# Patient Record
Sex: Female | Born: 1975 | Race: Black or African American | Hispanic: No | Marital: Single | Smoking: Current some day smoker
Health system: Southern US, Community
[De-identification: ages and names within clinical notes are randomized; demographics above are authoritative.]

## PROBLEM LIST (undated history)

## (undated) DIAGNOSIS — F209 Schizophrenia, unspecified: Secondary | ICD-10-CM

## (undated) HISTORY — PX: OTHER SURGICAL HISTORY: SHX169

---

## 2013-02-20 ENCOUNTER — Emergency Department (HOSPITAL_BASED_OUTPATIENT_CLINIC_OR_DEPARTMENT_OTHER)
Admission: EM | Admit: 2013-02-20 | Discharge: 2013-02-20 | Disposition: A | Discharge status: Home / Self Care | Payer: Self-pay | Attending: Emergency Medicine | Admitting: Emergency Medicine

## 2013-02-20 ENCOUNTER — Encounter (HOSPITAL_BASED_OUTPATIENT_CLINIC_OR_DEPARTMENT_OTHER): Payer: Self-pay | Admitting: Emergency Medicine

## 2013-02-20 DIAGNOSIS — Z8659 Personal history of other mental and behavioral disorders: Secondary | ICD-10-CM | POA: Insufficient documentation

## 2013-02-20 DIAGNOSIS — G2589 Other specified extrapyramidal and movement disorders: Secondary | ICD-10-CM | POA: Insufficient documentation

## 2013-02-20 DIAGNOSIS — T43505A Adverse effect of unspecified antipsychotics and neuroleptics, initial encounter: Secondary | ICD-10-CM | POA: Insufficient documentation

## 2013-02-20 DIAGNOSIS — Z79899 Other long term (current) drug therapy: Secondary | ICD-10-CM | POA: Insufficient documentation

## 2013-02-20 DIAGNOSIS — F17200 Nicotine dependence, unspecified, uncomplicated: Secondary | ICD-10-CM | POA: Insufficient documentation

## 2013-02-20 HISTORY — DX: Schizophrenia, unspecified: F20.9

## 2013-02-20 MED ORDER — LORAZEPAM 1 MG PO TABS: 1.0000 mg | ORAL_TABLET | Freq: Four times a day (QID) | ORAL | Status: DC | PRN

## 2013-02-20 MED ORDER — LORAZEPAM 1 MG PO TABS
1.0000 mg | ORAL_TABLET | Freq: Once | ORAL | Status: AC
  Administered 2013-02-20: 1 mg via ORAL
  Filled 2013-02-20: qty 1

## 2013-02-20 NOTE — ED Provider Notes (Signed)
CSN: [id number]     Arrival date & time 02/20/13  1529 History  This chart was scribed for Neta Ehlers, MD by Elby Beck, ED Scribe. This patient was seen in room MH09/MH09 and the patient's care was started at 3:52 PM.   Chief Complaint  Patient presents with  . possible reaction to haldol     Patient is a 38 y.o. female presenting with allergic reaction. The history is provided by the patient and a parent. No language interpreter was used.  Allergic Reaction Presenting symptoms: no difficulty breathing and no difficulty swallowing   Presenting symptoms comment:  Inability to keep tongue in mouth, bilateral arm twitching Severity:  Moderate Prior episodes: history of similar reactions to Haldol. Context: medications (recently started back on Haldol)   Relieved by:  None tried Worsened by:  Nothing tried Ineffective treatments:  None tried   HPI Comments: Elizabeth Adkins is a 38 y.o. Female with a history of schizophrenia brought by EMS and accompanied by mother to the Emergency Department complaining of a suspected medication reaction that began 4 hours ago. She states that she recently began taking Haldol again, which she had taken in the past, but had been off of for 10 months. She states that she took 2 Haldol tablets Saturday, but that she has not had any yesterday or today. She states that about 4 hours ago, she had an onset of being unable to keep her tongue in her mouth along with bilateral arm twitching. She also reports feeling jittery. She states that her tongue feels swollen, but that she is still able to swallow normally. She states that she has a history of similar reactions with Haldol in the past- about a year ago. She states that the only other medication she has recently taken is Tylenol PM. She states that she is doing well with her history of schizophrenia, and she denies SI or HI. She denies nausea, emesis, diarrhea, fever, diaphoresis, heart racing, swelling anywhere  in her body or any other symptoms.   Past Medical History  Diagnosis Date  . Schizophrenia    History reviewed. No pertinent past surgical history. History reviewed. No pertinent family history. History  Substance Use Topics  . Smoking status: Current Some Day Smoker  . Smokeless tobacco: Not on file  . Alcohol Use: No   OB History   Grav Para Term Preterm Abortions TAB SAB Ect Mult Living                 Review of Systems  Constitutional: Negative for fever, chills, diaphoresis, activity change, appetite change and fatigue.  HENT: Negative for congestion, facial swelling, rhinorrhea, sore throat and trouble swallowing.        Inability to keep tongue in mouth  Eyes: Negative for photophobia and discharge.  Respiratory: Negative for cough, chest tightness and shortness of breath.   Cardiovascular: Negative for chest pain, palpitations and leg swelling.  Gastrointestinal: Negative for nausea, vomiting, abdominal pain and diarrhea.  Endocrine: Negative for polydipsia and polyuria.  Genitourinary: Negative for dysuria, frequency, difficulty urinating and pelvic pain.  Musculoskeletal: Negative for arthralgias, back pain, neck pain and neck stiffness.  Skin: Negative for color change and wound.  Allergic/Immunologic: Negative for immunocompromised state.  Neurological: Negative for facial asymmetry, weakness, numbness and headaches.       Bilateral arm twitching  Hematological: Does not bruise/bleed easily.  Psychiatric/Behavioral: Negative for suicidal ideas, confusion and agitation.       Feels "jittery". Denies  HI.    Allergies  Septra  Home Medications   Current Outpatient Rx  Name  Route  Sig  Dispense  Refill  . haloperidol (HALDOL) 10 MG tablet   Oral   Take 10 mg by mouth 4 (four) times daily.          Triage Vitals: BP 131/79  Pulse 114  Temp(Src) 98.1 F (36.7 C) (Oral)  Resp 16  SpO2 99%  Physical Exam  Nursing note and vitals  reviewed. Constitutional: She is oriented to person, place, and time. She appears well-developed and well-nourished. No distress.  HENT:  Head: Normocephalic.  Mouth/Throat: Oropharynx is clear and moist.  Repetitive tongue protruding movements.  Eyes: Pupils are equal, round, and reactive to light.  Neck: Neck supple.  Cardiovascular: Normal rate, regular rhythm and normal heart sounds.   Pulmonary/Chest: Effort normal and breath sounds normal. No respiratory distress. She has no wheezes.  Abdominal: Soft. She exhibits no distension. There is no tenderness. There is no rebound and no guarding.  Musculoskeletal: She exhibits no edema and no tenderness.  Neurological: She is alert and oriented to person, place, and time. She has normal strength. She displays no tremor. No cranial nerve deficit or sensory deficit. She exhibits normal muscle tone. Coordination and gait normal. GCS eye subscore is 4. GCS verbal subscore is 5. GCS motor subscore is 6.  Abnormal protruding mvmts of tongue  Skin: Skin is warm and dry.  Psychiatric: She has a normal mood and affect. Her speech is normal and behavior is normal. Thought content normal. Her mood appears not anxious. Her affect is not blunt and not labile. She is not actively hallucinating. She does not exhibit a depressed mood. She expresses no suicidal plans and no homicidal plans.    ED Course  Procedures (including critical care time)  DIAGNOSTIC STUDIES: Oxygen Saturation is 99% on RA, normal by my interpretation.    COORDINATION OF CARE: 4:00 PM- Ordered Ativan. Pt advised of plan for treatment and pt agrees.  Medications  LORazepam (ATIVAN) tablet 1 mg (1 mg Oral Given 02/20/13 1614)   Labs Review Labs Reviewed - No data to display Imaging Review No results found.  EKG Interpretation   None       MDM   1. Antipsychotic-induced akathisia, initial encounter    Pt is a 38 y.o. female with Pmhx as above who presents with akathisias  of tongue after restarting haldol 2 days ago.  She had not taken it in about 10 months and reports similar symptoms in the past. She denies current SI/HI, but told nursing she had SI 2 days ago which was why she took the haldol. She states her recent schizophrenia symptoms have been minimal. She also reports abnml, uncontrolled mvmts of her L arm, though I have not seen this on PE.  Other than tongue mvmts, PE nml.  No oropharyngeal swelling/edema.  Neuro exam otherwise benign. She is mildly tachycardic, but BP nml, no diaphoresis, no n./v, d/a, CP, SOB.  Pt given 50mg  PO benadryl by EMS,  Have given 1mg  ativan PO in dept. I suspect Akathisia due to haldol use, doubt tardive dyskinesia given the timing of her drug use.    5:26 PM Symptoms resolved.  Pt again denying current SI/HI, states she will call 911 or come back to ED if she has returning thoughts of SI/HI.  She will call her mental health provider at [facility] for close follow up appointment. She does not want to talk  to psychiatry today and with good f/u plan, denial of SI/HI, I feel she is safe to do so.      I personally performed the services described in this documentation, which was scribed in my presence. The recorded information has been reviewed and is accurate.     Neta Ehlers, MD 02/20/13 [phone]

## 2013-02-20 NOTE — Discharge Instructions (Signed)
Dystonias  The dystonias are movement disorders in which sustained muscle contractions cause twisting and repetitive movements or abnormal postures. The movements, which are involuntary and sometimes painful, may affect a single muscle; a group of muscles such as those in the arms, legs, or neck; or the entire body. Early symptoms (problems) may include a deterioration in handwriting after writing several lines, foot cramps, and a tendency of one foot to pull up or drag after running or walking some distance. Other possible symptoms are tremor and voice or speech difficulties. Birth injury (particularly due to lack of oxygen), certain infections, reactions to certain drugs, heavy-metal or carbon monoxide poisoning, trauma (damage caused by an accident), or stroke can cause dystonic symptoms. About half the cases of dystonia have no connection to disease or injury and are called primary or idiopathic dystonia. Of the primary dystonias, many cases appear to be inherited in a dominant manner. Dystonias can also be symptoms of other diseases, some of which may be hereditary (passed down from parents). In some individuals, symptoms of a dystonia appear spontaneously in childhood between the ages of 5 and 16, usually in the foot or in the hand. For other individuals, the symptoms emerge in late adolescence or early adulthood.  TREATMENT   No one treatment has been found universally effective for dystonia. Instead, physicians use a variety of therapies (medications, surgery and other treatments such as physical therapy, splinting, stress management, and biofeedback), aimed at reducing or eliminating muscle spasms and pain. Since response to drugs varies among patients and even in the same person over time, the therapy must be individualized.  PROGNOSIS  The initial symptoms can be very mild and may be noticeable only after prolonged exertion, stress, or fatigue. Over a period of time, the symptoms may become more  noticeable and widespread and be unrelenting; sometimes, however, there is little or no progression.  RESEARCH BEING DONE  Investigators believe that the dystonias result from an abnormality in an area of the brain called the basal ganglia, where some of the messages that initiate muscle contractions are processed. Scientists suspect a defect in the body's ability to process a group of chemicals called neurotransmitters that help cells in the brain communicate with each other. Scientists at the [facility] laboratories have conducted detailed investigations of the pattern of muscle activity in persons with dystonias. Studies using EEG analysis and neuroimaging are probing brain activity. The search for the gene or genes responsible for some forms of dominantly inherited dystonias continues. In 1989, a team of researchers mapped a gene for early-onset torsion dystonia to chromosome 9; the gene was subsequently named DYT1. In 1997, the team sequenced the DYT1 gene and found that it codes for a previously unknown protein now called "torsin A."  Document Released: 01/23/2002 Document Revised: 04/27/2011 Document Reviewed: 02/02/2005  ExitCare® Patient Information ©2014 [employer].

## 2013-02-20 NOTE — ED Notes (Signed)
Pt called ems  For feeling like cant keep tongue in mouth. Pt can put tongue in mouth and swallow without any difficulty. While talking to this patient she begins to have some movements of hands and arms . Pt has recently started taking haldol again and took two on Saturday.

## 2013-07-19 ENCOUNTER — Encounter (HOSPITAL_BASED_OUTPATIENT_CLINIC_OR_DEPARTMENT_OTHER): Payer: Self-pay | Admitting: Emergency Medicine

## 2013-07-19 ENCOUNTER — Emergency Department (HOSPITAL_BASED_OUTPATIENT_CLINIC_OR_DEPARTMENT_OTHER): Payer: Self-pay

## 2013-07-19 ENCOUNTER — Emergency Department (HOSPITAL_BASED_OUTPATIENT_CLINIC_OR_DEPARTMENT_OTHER)
Admission: EM | Admit: 2013-07-19 | Discharge: 2013-07-19 | Disposition: A | Discharge status: Home / Self Care | Payer: Self-pay | Attending: Emergency Medicine | Admitting: Emergency Medicine

## 2013-07-19 DIAGNOSIS — F17200 Nicotine dependence, unspecified, uncomplicated: Secondary | ICD-10-CM | POA: Insufficient documentation

## 2013-07-19 DIAGNOSIS — Z8659 Personal history of other mental and behavioral disorders: Secondary | ICD-10-CM | POA: Insufficient documentation

## 2013-07-19 DIAGNOSIS — Z3202 Encounter for pregnancy test, result negative: Secondary | ICD-10-CM | POA: Insufficient documentation

## 2013-07-19 DIAGNOSIS — D259 Leiomyoma of uterus, unspecified: Secondary | ICD-10-CM | POA: Insufficient documentation

## 2013-07-19 DIAGNOSIS — D219 Benign neoplasm of connective and other soft tissue, unspecified: Secondary | ICD-10-CM

## 2013-07-19 LAB — CBC WITH DIFFERENTIAL/PLATELET
BASOS ABS: 0 10*3/uL (ref 0.0–0.1)
BASOS PCT: 0 % (ref 0–1)
EOS ABS: 0.4 10*3/uL (ref 0.0–0.7)
Eosinophils Relative: 4 % (ref 0–5)
HCT: 45.6 % (ref 36.0–46.0)
Hemoglobin: 16.1 g/dL — ABNORMAL HIGH (ref 12.0–15.0)
Lymphocytes Relative: 36 % (ref 12–46)
Lymphs Abs: 3.3 10*3/uL (ref 0.7–4.0)
MCH: 34.8 pg — AB (ref 26.0–34.0)
MCHC: 35.3 g/dL (ref 30.0–36.0)
MCV: 98.7 fL (ref 78.0–100.0)
Monocytes Absolute: 0.6 10*3/uL (ref 0.1–1.0)
Monocytes Relative: 6 % (ref 3–12)
NEUTROS ABS: 4.9 10*3/uL (ref 1.7–7.7)
NEUTROS PCT: 54 % (ref 43–77)
PLATELETS: 338 10*3/uL (ref 150–400)
RBC: 4.62 MIL/uL (ref 3.87–5.11)
RDW: 15.3 % (ref 11.5–15.5)
WBC: 9.2 10*3/uL (ref 4.0–10.5)

## 2013-07-19 LAB — URINALYSIS, ROUTINE W REFLEX MICROSCOPIC
GLUCOSE, UA: NEGATIVE mg/dL
Hgb urine dipstick: NEGATIVE
KETONES UR: 15 mg/dL — AB
Leukocytes, UA: NEGATIVE
NITRITE: NEGATIVE
PROTEIN: NEGATIVE mg/dL
Specific Gravity, Urine: 1.024 (ref 1.005–1.030)
Urobilinogen, UA: 0.2 mg/dL (ref 0.0–1.0)
pH: 5 (ref 5.0–8.0)

## 2013-07-19 LAB — COMPREHENSIVE METABOLIC PANEL
ALBUMIN: 4.1 g/dL (ref 3.5–5.2)
ALK PHOS: 92 U/L (ref 39–117)
ALT: 32 U/L (ref 0–35)
AST: 34 U/L (ref 0–37)
BUN: 7 mg/dL (ref 6–23)
CHLORIDE: 101 meq/L (ref 96–112)
CO2: 23 mEq/L (ref 19–32)
Calcium: 9.6 mg/dL (ref 8.4–10.5)
Creatinine, Ser: 1 mg/dL (ref 0.50–1.10)
GFR calc Af Amer: 82 mL/min — ABNORMAL LOW (ref >90)
GFR calc non Af Amer: 71 mL/min — ABNORMAL LOW (ref >90)
Glucose, Bld: 87 mg/dL (ref 70–99)
POTASSIUM: 4.2 meq/L (ref 3.7–5.3)
SODIUM: 138 meq/L (ref 137–147)
TOTAL PROTEIN: 8 g/dL (ref 6.0–8.3)
Total Bilirubin: 0.4 mg/dL (ref 0.3–1.2)

## 2013-07-19 LAB — PREGNANCY, URINE: PREG TEST UR: NEGATIVE

## 2013-07-19 LAB — OCCULT BLOOD X 1 CARD TO LAB, STOOL: Fecal Occult Bld: NEGATIVE

## 2013-07-19 MED ORDER — SODIUM CHLORIDE 0.9 % IV SOLN
Freq: Once | INTRAVENOUS | Status: AC
  Administered 2013-07-19: 15:00:00 via INTRAVENOUS

## 2013-07-19 MED ORDER — IOHEXOL 300 MG/ML  SOLN
50.0000 mL | Freq: Once | INTRAMUSCULAR | Status: AC | PRN
  Administered 2013-07-19: 50 mL via INTRAVENOUS

## 2013-07-19 MED ORDER — IOHEXOL 300 MG/ML  SOLN
100.0000 mL | Freq: Once | INTRAMUSCULAR | Status: AC | PRN
  Administered 2013-07-19: 100 mL via INTRAVENOUS

## 2013-07-19 MED ORDER — HYDROCODONE-ACETAMINOPHEN 5-325 MG PO TABS: 2.0000 | ORAL_TABLET | ORAL | Status: DC | PRN

## 2013-07-19 NOTE — ED Provider Notes (Signed)
CSN: [id number]     Arrival date & time 07/19/13  1349 History   First MD Initiated Contact with Patient 07/19/13 1353     Chief Complaint  Patient presents with  . Abdominal Pain     (Consider location/radiation/quality/duration/timing/severity/associated sxs/prior Treatment) Patient is a 38 y.o. female presenting with abdominal pain. The history is provided by the patient. No language interpreter was used.  Abdominal Pain Pain location:  Generalized Pain quality: aching and fullness   Pain radiates to:  Does not radiate Pain severity:  Moderate Onset quality:  Gradual Duration:  2 days Timing:  Constant Progression:  Worsening Chronicity:  New Context: not sick contacts   Relieved by:  Nothing Worsened by:  Nothing tried Ineffective treatments:  None tried Associated symptoms: no nausea and no vomiting     Past Medical History  Diagnosis Date  . Schizophrenia    History reviewed. No pertinent past surgical history. History reviewed. No pertinent family history. History  Substance Use Topics  . Smoking status: Current Some Day Smoker -- 1.00 packs/day    Types: Cigarettes  . Smokeless tobacco: Not on file  . Alcohol Use: No   OB History   Grav Para Term Preterm Abortions TAB SAB Ect Mult Living                 Review of Systems  Gastrointestinal: Positive for abdominal pain. Negative for nausea and vomiting.  All other systems reviewed and are negative.     Allergies  Septra  Home Medications   Prior to Admission medications   Not on File   BP 146/100  Pulse 86  Temp(Src) 98.5 F (36.9 C) (Oral)  Resp 16  Ht 5\' 2"  (1.575 m)  Wt 190 lb (86.183 kg)  BMI 34.74 kg/m2  SpO2 99%  LMP 07/12/2013 Physical Exam  Nursing note and vitals reviewed. Constitutional: She appears well-developed and well-nourished.  HENT:  Head: Normocephalic and atraumatic.  Eyes: Conjunctivae and EOM are normal. Pupils are equal, round, and reactive to light.  Neck:  Normal range of motion. Neck supple.  Cardiovascular: Normal rate.   Pulmonary/Chest: Effort normal.  Abdominal: Soft. There is tenderness.  Swollen to 2 inches above umblicus.  Palpable fibroid  Musculoskeletal: Normal range of motion.  Neurological: She is alert.  Skin: Skin is warm.  Psychiatric: She has a normal mood and affect.    ED Course  Procedures (including critical care time) Labs Review Labs Reviewed  URINALYSIS, ROUTINE W REFLEX MICROSCOPIC  PREGNANCY, URINE    Imaging Review No results found.   EKG Interpretation None      MDM   Final diagnoses:  Fibroid    Stool negative for blood. Labs normal.   Ct shows large fibroid.     Pt advised she needs to follow up for gyn evaluation.        Lynch, PA-C 07/19/13 [phone]

## 2013-07-19 NOTE — Discharge Instructions (Signed)
Fibroids Fibroids are lumps (tumors) that can occur any place in a woman's body. These lumps are not cancerous. Fibroids vary in size, weight, and where they grow. HOME CARE  Do not take aspirin.  Write down the number of pads or tampons you use during your period. Tell your doctor. This can help determine the best treatment for you. GET HELP RIGHT AWAY IF:  You have pain in your lower belly (abdomen) that is not helped with medicine.  You have cramps that are not helped with medicine.  You have more bleeding between or during your period.  You feel lightheaded or pass out (faint).  Your lower belly pain gets worse. MAKE SURE YOU:  Understand these instructions.  Will watch your condition.  Will get help right away if you are not doing well or get worse. Document Released: 03/07/2010 Document Revised: 04/27/2011 Document Reviewed: 03/07/2010 ExitCare Patient Information 2014 [employer].  

## 2013-07-19 NOTE — ED Notes (Signed)
PA at bedside for rectal exam.

## 2013-07-19 NOTE — ED Provider Notes (Signed)
Medical screening examination/treatment/procedure(s) were performed by non-physician practitioner and as supervising physician I was immediately available for consultation/collaboration.   EKG Interpretation None        Manish Ruggiero, MD 07/19/13 2328 

## 2013-07-19 NOTE — ED Notes (Addendum)
Pt c/o diffuse abd pain with nausea only also reports black stools x 2 days LMP 2 weeks ago

## 2013-09-08 ENCOUNTER — Other Ambulatory Visit: Payer: Self-pay | Admitting: *Deleted

## 2013-09-08 ENCOUNTER — Telehealth: Payer: Self-pay | Admitting: *Deleted

## 2013-09-08 DIAGNOSIS — D259 Leiomyoma of uterus, unspecified: Secondary | ICD-10-CM

## 2013-09-08 NOTE — Telephone Encounter (Signed)
Contacted patient, informed her of referral and ultrasound appointment on September 14, 2013 at 2:15.  Special instructions given, patient has no further questions.

## 2013-09-14 ENCOUNTER — Ambulatory Visit (HOSPITAL_COMMUNITY)
Admission: RE | Admit: 2013-09-14 | Discharge: 2013-09-14 | Disposition: A | Discharge status: Home / Self Care | Payer: Self-pay | Source: Ambulatory Visit | Attending: Obstetrics & Gynecology | Admitting: Obstetrics & Gynecology

## 2013-09-14 DIAGNOSIS — D252 Subserosal leiomyoma of uterus: Secondary | ICD-10-CM | POA: Insufficient documentation

## 2013-09-14 DIAGNOSIS — D259 Leiomyoma of uterus, unspecified: Secondary | ICD-10-CM

## 2013-09-14 DIAGNOSIS — N949 Unspecified condition associated with female genital organs and menstrual cycle: Secondary | ICD-10-CM | POA: Insufficient documentation

## 2013-09-15 ENCOUNTER — Encounter: Payer: Self-pay | Admitting: *Deleted

## 2013-11-06 ENCOUNTER — Encounter: Payer: Self-pay | Admitting: Obstetrics & Gynecology

## 2013-11-06 ENCOUNTER — Ambulatory Visit (INDEPENDENT_AMBULATORY_CARE_PROVIDER_SITE_OTHER): Payer: Self-pay | Admitting: Obstetrics & Gynecology

## 2013-11-06 ENCOUNTER — Other Ambulatory Visit (HOSPITAL_COMMUNITY)
Admission: RE | Admit: 2013-11-06 | Discharge: 2013-11-06 | Disposition: A | Discharge status: Home / Self Care | Payer: Self-pay | Source: Ambulatory Visit | Attending: Obstetrics & Gynecology | Admitting: Obstetrics & Gynecology

## 2013-11-06 VITALS — BP 140/87 | HR 108 | Temp 98.0°F | Ht 61.0 in | Wt 175.4 lb

## 2013-11-06 DIAGNOSIS — Z1151 Encounter for screening for human papillomavirus (HPV): Secondary | ICD-10-CM

## 2013-11-06 DIAGNOSIS — N939 Abnormal uterine and vaginal bleeding, unspecified: Principal | ICD-10-CM | POA: Insufficient documentation

## 2013-11-06 DIAGNOSIS — D219 Benign neoplasm of connective and other soft tissue, unspecified: Secondary | ICD-10-CM | POA: Insufficient documentation

## 2013-11-06 DIAGNOSIS — Z1159 Encounter for screening for other viral diseases: Secondary | ICD-10-CM

## 2013-11-06 DIAGNOSIS — Z118 Encounter for screening for other infectious and parasitic diseases: Secondary | ICD-10-CM

## 2013-11-06 DIAGNOSIS — Z124 Encounter for screening for malignant neoplasm of cervix: Secondary | ICD-10-CM

## 2013-11-06 DIAGNOSIS — N920 Excessive and frequent menstruation with regular cycle: Secondary | ICD-10-CM

## 2013-11-06 DIAGNOSIS — D251 Intramural leiomyoma of uterus: Secondary | ICD-10-CM

## 2013-11-06 DIAGNOSIS — N926 Irregular menstruation, unspecified: Secondary | ICD-10-CM | POA: Insufficient documentation

## 2013-11-06 MED ORDER — MEGESTROL ACETATE 20 MG PO TABS: 40.0000 mg | ORAL_TABLET | Freq: Two times a day (BID) | ORAL | Status: DC

## 2013-11-06 MED ORDER — IBUPROFEN 600 MG PO TABS: 600.0000 mg | ORAL_TABLET | Freq: Four times a day (QID) | ORAL | Status: DC | PRN

## 2013-11-06 MED ORDER — HYDROCODONE-ACETAMINOPHEN 5-325 MG PO TABS: 1.0000 | ORAL_TABLET | ORAL | Status: DC | PRN

## 2013-11-06 NOTE — Progress Notes (Signed)
CLINIC ENCOUNTER NOTE  History:  38 y.o. G0 here today for discussion about hysterectomy for her symptomatic uterine fibroids and menorrhagia. She is not interested in future childbearing.  Accompanied by her female partner.  Reports occasional moderate-severe pain and menorrhagia. No signs/symptoms of anemia.  The following portions of the patient's history were reviewed and updated as appropriate: allergies, current medications, past family history, past medical history, past social history, past surgical history and problem list.  Review of Systems:  Pertinent items are noted in HPI.  Objective:  Physical Exam BP 140/87  Pulse 108  Temp(Src) 98 F (36.7 C) (Oral)  Ht 5\' 1"  (1.549 m)  Wt 175 lb 6.4 oz (79.561 kg)  BMI 33.16 kg/m2  LMP 11/04/2013 Gen: NAD Abd: Soft, globular enlarged fibroid uterus palpated about 5 cm above umbilicus, mild tenderness on palpation, nondistended Pelvic: Normal appearing external genitalia; normal appearing vaginal mucosa and cervix.  Pap smear done. Normal discharge.  Small uterus, no other palpable masses, no uterine or adnexal tenderness  ENDOMETRIAL BIOPSY     The indications for endometrial biopsy were reviewed.   Risks of the biopsy including cramping, bleeding, infection, uterine perforation, inadequate specimen and need for additional procedures  were discussed. The patient states she understands and agrees to undergo procedure today. Consent was signed. Time out was performed. Urine HCG was negative. During the pelvic exam, the cervix was prepped with Betadine. A single-toothed tenaculum was placed on the anterior lip of the cervix to stabilize it. The 3 mm pipelle was introduced into the endometrial cavity without difficulty to a depth of 9 cm, and a moderate amount of tissue was obtained and sent to pathology. The instruments were removed from the patient's vagina. Minimal bleeding from the cervix was noted. The patient tolerated the procedure  with extreme difficulty; was screaming and moving up the bed during the procedure.  Routine post-procedure instructions were given to the patient.    Labs and Imaging 09/14/13 TRANSABDOMINAL AND TRANSVAGINAL ULTRASOUND OF PELVIS FINDINGS: Uterus Measurements: 9.4 x 6.0 x 5.4 cm. Multiple fibroids are identified. The largest is a pedunculated subserosal fibroid arising from the posterior fundus measuring 15 x 8.7 x 13.8 cm. Within the lower uterine segment there is a subserosal fibroid measuring 3.3 x 3.2 x 5.1 cm. There is a subserosal fibroid arising from the posterior myometrium measuring 2.5 x 2.1 x 2.5 cm. Endometrium Thickness: 6.6 mm. No focal abnormality visualized. Right ovary Measurements: 2.9 x 1.4 x 2.7 cm. Normal appearance/no adnexal mass. Left ovary  Measurements: 2.7 x 1.6 x 2.4 cm. Normal appearance/no adnexal mass. Other findings Trace free fluid. IMPRESSION: Enlarged fibroid uterus as above.   Assessment & Plan:  Pap smear and endometrial biopsy done, will follow up results and manage accordingly.  Patient desires definitive management with hysterectomy. I proposed doing total abdominal hysterectomy (TAH) and prophylactic bilateral salpingectomy. No indication for oophorectomy. Patient agrees with this proposed surgery. The risks of surgery were discussed in detail with the patient including but not limited to: bleeding which may require transfusion or reoperation; infection which may require antibiotics; injury to bowel, bladder, ureters or other surrounding organs; need for additional procedures including laparotomy; thromboembolic phenomenon, incisional problems and other postoperative/anesthesia complications. Patient was also advised that she will remain in house for 2 nights; and expected recovery time after a hysterectomy is 6-8 weeks. Likelihood of success in alleviating the patient's symptoms was discussed. She was told that she will be contacted by our surgical scheduler  regarding  the time and date of her surgery; routine preoperative instructions of having nothing to eat or drink after midnight on the day prior to surgery and also coming to the hospital 1 1/2 hours prior to her time of surgery were also emphasized. She was told she may be called for a preoperative appointment about a week prior to surgery and will be given further preoperative instructions at that visit. Routine postoperative instructions will be reviewed with the patient and her family in detail after surgery.  Printed patient education handouts about the procedure was given to the patient to review at home. In the meantime, she will continue Megace; bleeding precautions were reviewed.  Vicodin and Ibuprofen also prescribed for pain.   Verita Schneiders, MD, [place] Attending [place] for [employer], [place]

## 2013-11-06 NOTE — Patient Instructions (Signed)
Hysterectomy Information  A hysterectomy is a surgery in which your uterus is removed. This surgery may be done to treat various medical problems. After the surgery, you will no longer have menstrual periods. The surgery will also make you unable to become pregnant (sterile). The fallopian tubes and ovaries can be removed (bilateral salpingo-oophorectomy) during this surgery as well.  REASONS FOR A HYSTERECTOMY  Persistent, abnormal bleeding.  Lasting (chronic) pelvic pain or infection.  The lining of the uterus (endometrium) starts growing outside the uterus (endometriosis).  The endometrium starts growing in the muscle of the uterus (adenomyosis).  The uterus falls down into the vagina (pelvic organ prolapse).  Noncancerous growths in the uterus (uterine fibroids) that cause symptoms.  Precancerous cells.  Cervical cancer or uterine cancer. TYPES OF HYSTERECTOMIES  Supracervical hysterectomy--In this type, the top part of the uterus is removed, but not the cervix.  Total hysterectomy--The uterus and cervix are removed.  Radical hysterectomy--The uterus, the cervix, and the fibrous tissue that holds the uterus in place in the pelvis (parametrium) are removed. WAYS A HYSTERECTOMY CAN BE PERFORMED  Abdominal hysterectomy--A large surgical cut (incision) is made in the abdomen. The uterus is removed through this incision.  Vaginal hysterectomy--An incision is made in the vagina. The uterus is removed through this incision. There are no abdominal incisions.  Conventional laparoscopic hysterectomy--Three or four small incisions are made in the abdomen. A thin, lighted tube with a camera (laparoscope) is inserted into one of the incisions. Other tools are put through the other incisions. The uterus is cut into small pieces. The small pieces are removed through the incisions, or they are removed through the vagina.  Laparoscopically assisted vaginal hysterectomy (LAVH)--Three or four  small incisions are made in the abdomen. Part of the surgery is performed laparoscopically and part vaginally. The uterus is removed through the vagina.  Robot-assisted laparoscopic hysterectomy--A laparoscope and other tools are inserted into 3 or 4 small incisions in the abdomen. A computer-controlled device is used to give the surgeon a 3D image and to help control the surgical instruments. This allows for more precise movements of surgical instruments. The uterus is cut into small pieces and removed through the incisions or removed through the vagina. RISKS AND COMPLICATIONS  Possible complications associated with this procedure include:  Bleeding and risk of blood transfusion. Tell your health care provider if you do not want to receive any blood products.  Blood clots in the legs or lung.  Infection.  Injury to surrounding organs.  Problems or side effects related to anesthesia.  Conversion to an abdominal hysterectomy from one of the other techniques. WHAT TO EXPECT AFTER A HYSTERECTOMY  You will be given pain medicine.  You will need to have someone with you for the first 3-5 days after you go home.  You will need to follow up with your surgeon in 2-4 weeks after surgery to evaluate your progress.  You may have early menopause symptoms such as hot flashes, night sweats, and insomnia.  If you had a hysterectomy for a problem that was not cancer or not a condition that could lead to cancer, then you no longer need Pap tests. However, even if you no longer need a Pap test, a regular exam is a good idea to make sure no other problems are starting. Document Released: 07/29/2000 Document Revised: 11/23/2012 Document Reviewed: 10/10/2012 [facility] Patient Information 2015 [place], [place]. This information is not intended to replace advice given to you by your health care  provider. Make sure you discuss any questions you have with your health care provider.

## 2013-11-08 LAB — CYTOLOGY - PAP

## 2013-11-09 ENCOUNTER — Telehealth: Payer: Self-pay

## 2013-11-09 NOTE — Telephone Encounter (Signed)
Message copied by Geanie Logan on Thu Nov 09, 2013 11:18 AM ------      Message from: Verita Schneiders A      Created: Thu Nov 09, 2013  9:15 AM       Normal pap, negative HRHPV, GC and Chlam. Continue cervical cancer screening as recommended.  Benign endometrial biopsy. Please call to inform patient of results. ------

## 2013-11-09 NOTE — Telephone Encounter (Addendum)
Attempted to call patient. No answer. Left message stating we are calling to inform you of results, please call clinic.   Patient returned call. Informed patient of normal results and the need for repeat pap in three years per normal cervical cancer screening guidelines. Patient verbalized understanding and asked when she was having surgery. Informed patient she should be receiving a call from surgical scheduler within the next few weeks. Patient verbalized understanding. No further questions or concerns.

## 2014-02-02 ENCOUNTER — Encounter: Payer: Self-pay | Admitting: *Deleted

## 2014-02-26 ENCOUNTER — Encounter (HOSPITAL_COMMUNITY): Payer: Self-pay

## 2014-02-26 ENCOUNTER — Encounter (HOSPITAL_COMMUNITY)
Admission: RE | Admit: 2014-02-26 | Discharge: 2014-02-26 | Disposition: A | Discharge status: Home / Self Care | Payer: Self-pay | Source: Ambulatory Visit | Attending: Obstetrics & Gynecology | Admitting: Obstetrics & Gynecology

## 2014-02-26 DIAGNOSIS — Z4659 Encounter for fitting and adjustment of other gastrointestinal appliance and device: Secondary | ICD-10-CM | POA: Insufficient documentation

## 2014-02-26 DIAGNOSIS — Z01818 Encounter for other preprocedural examination: Secondary | ICD-10-CM | POA: Insufficient documentation

## 2014-02-26 DIAGNOSIS — Z01812 Encounter for preprocedural laboratory examination: Secondary | ICD-10-CM | POA: Insufficient documentation

## 2014-02-26 LAB — CBC
HEMATOCRIT: 46.3 % — AB (ref 36.0–46.0)
HEMOGLOBIN: 16 g/dL — AB (ref 12.0–15.0)
MCH: 35.8 pg — ABNORMAL HIGH (ref 26.0–34.0)
MCHC: 34.6 g/dL (ref 30.0–36.0)
MCV: 103.6 fL — AB (ref 78.0–100.0)
Platelets: 371 10*3/uL (ref 150–400)
RBC: 4.47 MIL/uL (ref 3.87–5.11)
RDW: 15.9 % — ABNORMAL HIGH (ref 11.5–15.5)
WBC: 6.9 10*3/uL (ref 4.0–10.5)

## 2014-02-26 LAB — ABO/RH: ABO/RH(D): O POS

## 2014-02-26 NOTE — Patient Instructions (Signed)
   Your procedure is scheduled on: JAN 19 AT [place] through the Main Entrance of [facility] at: [place] up the phone at the desk and dial [phone] and inform us of your arrival.  Please call this number if you have any problems the morning of surgery: [phone]  Remember: Do not eat food after midnight: JAN 18  Do not drink clear liquids after: 9AM  Take these medicines the morning of surgery with a SIP OF WATER: NONE  Do not wear jewelry, make-up, or FINGER nail polish No metal in your hair or on your body. Do not wear lotions, powders, perfumes.  You may wear deodorant.  Do not bring valuables to the hospital. Contacts, dentures or bridgework may not be worn into surgery.  Leave suitcase in the car. After Surgery it may be brought to your room. For patients being admitted to the hospital, checkout time is 11:00am the day of discharge.    Patients discharged on the day of surgery will not be allowed to drive home.

## 2014-03-05 MED ORDER — DEXTROSE 5 % IV SOLN
2.0000 g | INTRAVENOUS | Status: AC
  Administered 2014-03-06: 2 g via INTRAVENOUS
  Filled 2014-03-05: qty 2

## 2014-03-05 NOTE — Anesthesia Preprocedure Evaluation (Addendum)
Anesthesia Evaluation  Patient identified by MRN, date of birth, ID band Patient awake    Reviewed: Allergy & Precautions, NPO status , Patient's Chart, lab work & pertinent test results  Airway Mallampati: II  TM Distance: >3 FB Neck ROM: Full    Dental no notable dental hx. (+) Dental Advisory Given   Pulmonary Current Smoker,  breath sounds clear to auscultation  Pulmonary exam normal       Cardiovascular Exercise Tolerance: Good negative cardio ROS  Rhythm:Regular Rate:Normal     Neuro/Psych PSYCHIATRIC DISORDERS Schizophrenia negative neurological ROS     GI/Hepatic negative GI ROS, Neg liver ROS,   Endo/Other  negative endocrine ROS  Renal/GU negative Renal ROS  negative genitourinary   Musculoskeletal negative musculoskeletal ROS (+)   Abdominal   Peds negative pediatric ROS (+)  Hematology negative hematology ROS (+)   Anesthesia Other Findings   Reproductive/Obstetrics negative OB ROS                            Anesthesia Physical Anesthesia Plan  ASA: II  Anesthesia Plan: General   Post-op Pain Management:    Induction: Intravenous  Airway Management Planned: Oral ETT  Additional Equipment:   Intra-op Plan:   Post-operative Plan: Extubation in OR  Informed Consent: I have reviewed the patients History and Physical, chart, labs and discussed the procedure including the risks, benefits and alternatives for the proposed anesthesia with the patient or authorized representative who has indicated his/her understanding and acceptance.   Dental advisory given  Plan Discussed with: CRNA  Anesthesia Plan Comments:         Anesthesia Quick Evaluation

## 2014-03-06 ENCOUNTER — Encounter (HOSPITAL_COMMUNITY): Payer: Self-pay | Admitting: Anesthesiology

## 2014-03-06 ENCOUNTER — Inpatient Hospital Stay (HOSPITAL_COMMUNITY): Payer: Self-pay | Admitting: Anesthesiology

## 2014-03-06 ENCOUNTER — Inpatient Hospital Stay (HOSPITAL_COMMUNITY)
Admission: RE | Admit: 2014-03-06 | Discharge: 2014-03-15 | DRG: 742 | Disposition: A | Discharge status: Home / Self Care | Payer: Self-pay | Source: Ambulatory Visit | Attending: Obstetrics & Gynecology | Admitting: Obstetrics & Gynecology

## 2014-03-06 ENCOUNTER — Inpatient Hospital Stay (HOSPITAL_COMMUNITY): Payer: Self-pay

## 2014-03-06 ENCOUNTER — Encounter (HOSPITAL_COMMUNITY)
Admission: RE | Disposition: A | Discharge status: Home / Self Care | Payer: Self-pay | Source: Ambulatory Visit | Attending: Obstetrics & Gynecology

## 2014-03-06 DIAGNOSIS — Z833 Family history of diabetes mellitus: Secondary | ICD-10-CM

## 2014-03-06 DIAGNOSIS — D219 Benign neoplasm of connective and other soft tissue, unspecified: Secondary | ICD-10-CM | POA: Diagnosis present

## 2014-03-06 DIAGNOSIS — Z90710 Acquired absence of both cervix and uterus: Secondary | ICD-10-CM | POA: Diagnosis not present

## 2014-03-06 DIAGNOSIS — F17210 Nicotine dependence, cigarettes, uncomplicated: Secondary | ICD-10-CM | POA: Diagnosis present

## 2014-03-06 DIAGNOSIS — Y838 Other surgical procedures as the cause of abnormal reaction of the patient, or of later complication, without mention of misadventure at the time of the procedure: Secondary | ICD-10-CM | POA: Diagnosis not present

## 2014-03-06 DIAGNOSIS — E876 Hypokalemia: Secondary | ICD-10-CM | POA: Diagnosis not present

## 2014-03-06 DIAGNOSIS — Z4659 Encounter for fitting and adjustment of other gastrointestinal appliance and device: Secondary | ICD-10-CM

## 2014-03-06 DIAGNOSIS — F209 Schizophrenia, unspecified: Secondary | ICD-10-CM | POA: Diagnosis present

## 2014-03-06 DIAGNOSIS — T814XXA Infection following a procedure, initial encounter: Secondary | ICD-10-CM | POA: Diagnosis not present

## 2014-03-06 DIAGNOSIS — Z9049 Acquired absence of other specified parts of digestive tract: Secondary | ICD-10-CM

## 2014-03-06 DIAGNOSIS — T8149XA Infection following a procedure, other surgical site, initial encounter: Secondary | ICD-10-CM | POA: Diagnosis not present

## 2014-03-06 DIAGNOSIS — L0889 Other specified local infections of the skin and subcutaneous tissue: Secondary | ICD-10-CM | POA: Diagnosis not present

## 2014-03-06 DIAGNOSIS — D252 Subserosal leiomyoma of uterus: Secondary | ICD-10-CM | POA: Diagnosis present

## 2014-03-06 DIAGNOSIS — N920 Excessive and frequent menstruation with regular cycle: Secondary | ICD-10-CM | POA: Diagnosis present

## 2014-03-06 DIAGNOSIS — K9172 Accidental puncture and laceration of a digestive system organ or structure during other procedure: Secondary | ICD-10-CM | POA: Diagnosis not present

## 2014-03-06 DIAGNOSIS — Z9889 Other specified postprocedural states: Secondary | ICD-10-CM

## 2014-03-06 DIAGNOSIS — Z8249 Family history of ischemic heart disease and other diseases of the circulatory system: Secondary | ICD-10-CM

## 2014-03-06 DIAGNOSIS — D251 Intramural leiomyoma of uterus: Principal | ICD-10-CM | POA: Diagnosis present

## 2014-03-06 DIAGNOSIS — Y92234 Operating room of hospital as the place of occurrence of the external cause: Secondary | ICD-10-CM

## 2014-03-06 HISTORY — PX: COLON RESECTION: SHX5231

## 2014-03-06 HISTORY — PX: BILATERAL SALPINGECTOMY: SHX5743

## 2014-03-06 HISTORY — PX: ABDOMINAL HYSTERECTOMY: SHX81

## 2014-03-06 LAB — PREGNANCY, URINE: Preg Test, Ur: NEGATIVE

## 2014-03-06 LAB — PREPARE RBC (CROSSMATCH)

## 2014-03-06 SURGERY — HYSTERECTOMY, ABDOMINAL
Anesthesia: General | Site: Abdomen

## 2014-03-06 MED ORDER — KETOROLAC TROMETHAMINE 30 MG/ML IJ SOLN
INTRAMUSCULAR | Status: AC
  Filled 2014-03-06: qty 1

## 2014-03-06 MED ORDER — ALBUTEROL SULFATE (2.5 MG/3ML) 0.083% IN NEBU: 3.0000 mL | INHALATION_SOLUTION | Freq: Four times a day (QID) | RESPIRATORY_TRACT | Status: DC | PRN

## 2014-03-06 MED ORDER — ROCURONIUM BROMIDE 100 MG/10ML IV SOLN
INTRAVENOUS | Status: DC | PRN
  Administered 2014-03-06: 20 mg via INTRAVENOUS
  Administered 2014-03-06: 10 mg via INTRAVENOUS
  Administered 2014-03-06: 40 mg via INTRAVENOUS
  Administered 2014-03-06 (×4): 10 mg via INTRAVENOUS

## 2014-03-06 MED ORDER — DIPHENHYDRAMINE HCL 50 MG/ML IJ SOLN
12.5000 mg | Freq: Four times a day (QID) | INTRAMUSCULAR | Status: DC | PRN
  Administered 2014-03-07 – 2014-03-08 (×3): 12.5 mg via INTRAVENOUS
  Filled 2014-03-06 (×3): qty 1

## 2014-03-06 MED ORDER — PROPOFOL 10 MG/ML IV BOLUS
INTRAVENOUS | Status: AC
  Filled 2014-03-06: qty 20

## 2014-03-06 MED ORDER — LABETALOL HCL 5 MG/ML IV SOLN
INTRAVENOUS | Status: AC
  Filled 2014-03-06: qty 4

## 2014-03-06 MED ORDER — MIDAZOLAM HCL 2 MG/2ML IJ SOLN
INTRAMUSCULAR | Status: AC
  Filled 2014-03-06: qty 2

## 2014-03-06 MED ORDER — SODIUM CHLORIDE 0.9 % IJ SOLN
INTRAMUSCULAR | Status: AC
  Filled 2014-03-06: qty 10

## 2014-03-06 MED ORDER — DIPHENHYDRAMINE HCL 12.5 MG/5ML PO ELIX
12.5000 mg | ORAL_SOLUTION | Freq: Four times a day (QID) | ORAL | Status: DC | PRN
  Administered 2014-03-08: 12.5 mg via ORAL
  Filled 2014-03-06: qty 5

## 2014-03-06 MED ORDER — GLYCOPYRROLATE 0.2 MG/ML IJ SOLN
INTRAMUSCULAR | Status: DC | PRN
  Administered 2014-03-06: .4 mg via INTRAVENOUS

## 2014-03-06 MED ORDER — 0.9 % SODIUM CHLORIDE (POUR BTL) OPTIME
TOPICAL | Status: DC | PRN
  Administered 2014-03-06 (×4): 1000 mL

## 2014-03-06 MED ORDER — ACETAMINOPHEN 325 MG PO TABS: 325.0000 mg | ORAL_TABLET | ORAL | Status: DC | PRN

## 2014-03-06 MED ORDER — FENTANYL CITRATE 0.05 MG/ML IJ SOLN
INTRAMUSCULAR | Status: AC
  Filled 2014-03-06: qty 5

## 2014-03-06 MED ORDER — SCOPOLAMINE 1 MG/3DAYS TD PT72
MEDICATED_PATCH | TRANSDERMAL | Status: AC
  Administered 2014-03-06: 1.5 mg
  Filled 2014-03-06: qty 1

## 2014-03-06 MED ORDER — HYDROMORPHONE HCL 1 MG/ML IJ SOLN
INTRAMUSCULAR | Status: AC
  Filled 2014-03-06: qty 1

## 2014-03-06 MED ORDER — PROPOFOL 10 MG/ML IV BOLUS
INTRAVENOUS | Status: DC | PRN
  Administered 2014-03-06: 200 mg via INTRAVENOUS

## 2014-03-06 MED ORDER — NEOSTIGMINE METHYLSULFATE 10 MG/10ML IV SOLN
INTRAVENOUS | Status: DC | PRN
  Administered 2014-03-06: 3 mg via INTRAVENOUS

## 2014-03-06 MED ORDER — ONDANSETRON HCL 4 MG/2ML IJ SOLN
4.0000 mg | Freq: Four times a day (QID) | INTRAMUSCULAR | Status: DC | PRN
Start: 2014-03-06 — End: 2014-03-11

## 2014-03-06 MED ORDER — HYDRALAZINE HCL 20 MG/ML IJ SOLN: 10.0000 mg | INTRAMUSCULAR | Status: DC | PRN

## 2014-03-06 MED ORDER — HYDROMORPHONE HCL 1 MG/ML IJ SOLN
0.2500 mg | INTRAMUSCULAR | Status: DC | PRN
  Administered 2014-03-06 (×6): 0.5 mg via INTRAVENOUS

## 2014-03-06 MED ORDER — LIDOCAINE HCL (CARDIAC) 20 MG/ML IV SOLN
INTRAVENOUS | Status: DC | PRN
  Administered 2014-03-06 (×2): 20 mg via INTRAVENOUS
  Administered 2014-03-06: 60 mg via INTRAVENOUS

## 2014-03-06 MED ORDER — ROCURONIUM BROMIDE 100 MG/10ML IV SOLN
INTRAVENOUS | Status: AC
  Filled 2014-03-06: qty 1

## 2014-03-06 MED ORDER — NALOXONE HCL 0.4 MG/ML IJ SOLN: 0.4000 mg | INTRAMUSCULAR | Status: DC | PRN

## 2014-03-06 MED ORDER — ACETAMINOPHEN 160 MG/5ML PO SOLN: 325.0000 mg | ORAL | Status: DC | PRN

## 2014-03-06 MED ORDER — SODIUM CHLORIDE 0.9 % IJ SOLN: 9.0000 mL | INTRAMUSCULAR | Status: DC | PRN

## 2014-03-06 MED ORDER — LACTATED RINGERS IV SOLN
INTRAVENOUS | Status: DC
  Administered 2014-03-07 – 2014-03-09 (×9): via INTRAVENOUS

## 2014-03-06 MED ORDER — HYDROMORPHONE HCL 1 MG/ML IJ SOLN
INTRAMUSCULAR | Status: AC
  Administered 2014-03-06: 0.5 mg via INTRAVENOUS
  Filled 2014-03-06: qty 1

## 2014-03-06 MED ORDER — FENTANYL CITRATE 0.05 MG/ML IJ SOLN
INTRAMUSCULAR | Status: AC
  Filled 2014-03-06: qty 2

## 2014-03-06 MED ORDER — FENTANYL CITRATE 0.05 MG/ML IJ SOLN: 25.0000 ug | INTRAMUSCULAR | Status: DC | PRN

## 2014-03-06 MED ORDER — LIDOCAINE HCL (CARDIAC) 20 MG/ML IV SOLN
INTRAVENOUS | Status: AC
  Filled 2014-03-06: qty 5

## 2014-03-06 MED ORDER — HYDROMORPHONE 0.3 MG/ML IV SOLN
INTRAVENOUS | Status: DC
  Administered 2014-03-06: 0.6 mg via INTRAVENOUS
  Administered 2014-03-06: 0.3 mL via INTRAVENOUS
  Administered 2014-03-07: 14 mg via INTRAVENOUS
  Administered 2014-03-07: 1.8 mg via INTRAVENOUS
  Administered 2014-03-07: 14:00:00 via INTRAVENOUS
  Administered 2014-03-07 (×2): 1.2 mg via INTRAVENOUS
  Administered 2014-03-07: 1.5 mg via INTRAVENOUS
  Administered 2014-03-07: 2.4 mg via INTRAVENOUS
  Administered 2014-03-08: 1.93 mg via INTRAVENOUS
  Administered 2014-03-08: 1.2 mg via INTRAVENOUS
  Administered 2014-03-08: 0.9 mg via INTRAVENOUS
  Administered 2014-03-08: 3 mg via INTRAVENOUS
  Administered 2014-03-08: 17:00:00 via INTRAVENOUS
  Administered 2014-03-08: 3 mg via INTRAVENOUS
  Administered 2014-03-09: 0.9 mg via INTRAVENOUS
  Administered 2014-03-09: 0.6 mg via INTRAVENOUS
  Administered 2014-03-09: 1.2 mg via INTRAVENOUS
  Administered 2014-03-09: 0.6 mg via INTRAVENOUS
  Administered 2014-03-09: 0.3 mg via INTRAVENOUS
  Administered 2014-03-10: 05:00:00 via INTRAVENOUS
  Administered 2014-03-10: 0.3 mg via INTRAVENOUS
  Administered 2014-03-10: 0.6 mg via INTRAVENOUS
  Filled 2014-03-06 (×5): qty 25

## 2014-03-06 MED ORDER — ONDANSETRON HCL 4 MG/2ML IJ SOLN
INTRAMUSCULAR | Status: DC | PRN
  Administered 2014-03-06: 4 mg via INTRAVENOUS

## 2014-03-06 MED ORDER — DEXTROSE 5 % IV SOLN
2.0000 g | Freq: Two times a day (BID) | INTRAVENOUS | Status: AC
  Administered 2014-03-07 – 2014-03-08 (×3): 2 g via INTRAVENOUS
  Filled 2014-03-06 (×3): qty 2

## 2014-03-06 MED ORDER — DEXAMETHASONE SODIUM PHOSPHATE 10 MG/ML IJ SOLN
INTRAMUSCULAR | Status: DC | PRN
  Administered 2014-03-06: 4 mg via INTRAVENOUS

## 2014-03-06 MED ORDER — NEOSTIGMINE METHYLSULFATE 10 MG/10ML IV SOLN
INTRAVENOUS | Status: AC
  Filled 2014-03-06: qty 1

## 2014-03-06 MED ORDER — LABETALOL HCL 5 MG/ML IV SOLN
INTRAVENOUS | Status: DC | PRN
  Administered 2014-03-06: 5 mg via INTRAVENOUS

## 2014-03-06 MED ORDER — HYDROMORPHONE HCL 1 MG/ML IJ SOLN: 0.2000 mg | INTRAMUSCULAR | Status: DC | PRN

## 2014-03-06 MED ORDER — ONDANSETRON HCL 4 MG PO TABS: 4.0000 mg | ORAL_TABLET | Freq: Four times a day (QID) | ORAL | Status: DC | PRN

## 2014-03-06 MED ORDER — DEXAMETHASONE SODIUM PHOSPHATE 10 MG/ML IJ SOLN
INTRAMUSCULAR | Status: AC
  Filled 2014-03-06: qty 1

## 2014-03-06 MED ORDER — ONDANSETRON HCL 4 MG/2ML IJ SOLN: 4.0000 mg | Freq: Once | INTRAMUSCULAR | Status: DC | PRN

## 2014-03-06 MED ORDER — MENTHOL 3 MG MT LOZG: 1.0000 | LOZENGE | OROMUCOSAL | Status: DC | PRN

## 2014-03-06 MED ORDER — GLYCOPYRROLATE 0.2 MG/ML IJ SOLN
INTRAMUSCULAR | Status: AC
  Filled 2014-03-06: qty 3

## 2014-03-06 MED ORDER — FENTANYL CITRATE 0.05 MG/ML IJ SOLN
INTRAMUSCULAR | Status: DC | PRN
  Administered 2014-03-06 (×11): 50 ug via INTRAVENOUS

## 2014-03-06 MED ORDER — LACTATED RINGERS IV SOLN
INTRAVENOUS | Status: DC
  Administered 2014-03-06 (×6): via INTRAVENOUS

## 2014-03-06 MED ORDER — SIMETHICONE 80 MG PO CHEW: 80.0000 mg | CHEWABLE_TABLET | Freq: Four times a day (QID) | ORAL | Status: DC | PRN

## 2014-03-06 MED ORDER — ONDANSETRON HCL 4 MG/2ML IJ SOLN: 4.0000 mg | Freq: Four times a day (QID) | INTRAMUSCULAR | Status: DC | PRN

## 2014-03-06 MED ORDER — ONDANSETRON HCL 4 MG/2ML IJ SOLN
INTRAMUSCULAR | Status: AC
Start: 2014-03-06 — End: 2014-03-06
  Filled 2014-03-06: qty 2

## 2014-03-06 MED ORDER — HYDROMORPHONE HCL 1 MG/ML IJ SOLN
INTRAMUSCULAR | Status: DC | PRN
Start: 2014-03-06 — End: 2014-03-06
  Administered 2014-03-06 (×2): 1 mg via INTRAVENOUS

## 2014-03-06 MED ORDER — MIDAZOLAM HCL 2 MG/2ML IJ SOLN
INTRAMUSCULAR | Status: DC | PRN
  Administered 2014-03-06: 1 mg via INTRAVENOUS

## 2014-03-06 MED ORDER — PANTOPRAZOLE SODIUM 40 MG IV SOLR
40.0000 mg | INTRAVENOUS | Status: DC
  Administered 2014-03-06 – 2014-03-09 (×4): 40 mg via INTRAVENOUS
  Filled 2014-03-06 (×5): qty 40

## 2014-03-06 SURGICAL SUPPLY — 64 items
BARRIER ADHS 3X4 INTERCEED (GAUZE/BANDAGES/DRESSINGS) IMPLANT
BENZOIN TINCTURE PRP APPL 2/3 (GAUZE/BANDAGES/DRESSINGS) IMPLANT
CANISTER SUCT 3000ML (MISCELLANEOUS) ×4 IMPLANT
CELLS DAT CNTRL 66122 CELL SVR (MISCELLANEOUS) IMPLANT
CLOSURE WOUND 1/2 X4 (GAUZE/BANDAGES/DRESSINGS)
CLOTH BEACON ORANGE TIMEOUT ST (SAFETY) ×4 IMPLANT
CONT PATH 16OZ SNAP LID 3702 (MISCELLANEOUS) ×4 IMPLANT
COUNTER NEEDLE 1200 MAGNETIC (NEEDLE) ×8 IMPLANT
COVER BACK TABLE 60X90IN (DRAPES) ×4 IMPLANT
DECANTER SPIKE VIAL GLASS SM (MISCELLANEOUS) IMPLANT
DRAPE WARM FLUID 44X44 (DRAPE) IMPLANT
DRSG OPSITE POSTOP 4X10 (GAUZE/BANDAGES/DRESSINGS) ×4 IMPLANT
DURAPREP 26ML APPLICATOR (WOUND CARE) ×4 IMPLANT
ELECT LIGASURE LONG (ELECTRODE) ×4 IMPLANT
GAUZE SPONGE 4X4 16PLY XRAY LF (GAUZE/BANDAGES/DRESSINGS) ×4 IMPLANT
GLOVE BIO SURGEON STRL SZ 6.5 (GLOVE) ×3 IMPLANT
GLOVE BIO SURGEON STRL SZ7 (GLOVE) ×4 IMPLANT
GLOVE BIO SURGEONS STRL SZ 6.5 (GLOVE) ×1
GLOVE BIOGEL PI IND STRL 7.0 (GLOVE) ×16 IMPLANT
GLOVE BIOGEL PI IND STRL 8 (GLOVE) ×2 IMPLANT
GLOVE BIOGEL PI INDICATOR 7.0 (GLOVE) ×16
GLOVE BIOGEL PI INDICATOR 8 (GLOVE) ×2
GLOVE ECLIPSE 7.0 STRL STRAW (GLOVE) ×4 IMPLANT
GLOVE ECLIPSE 7.5 STRL STRAW (GLOVE) ×4 IMPLANT
GLOVE SURG SS PI 7.0 STRL IVOR (GLOVE) ×4 IMPLANT
GOWN STRL REUS W/ TWL XL LVL3 (GOWN DISPOSABLE) ×2 IMPLANT
GOWN STRL REUS W/TWL LRG LVL3 (GOWN DISPOSABLE) ×16 IMPLANT
GOWN STRL REUS W/TWL XL LVL3 (GOWN DISPOSABLE) ×2
NEEDLE HYPO 22GX1.5 SAFETY (NEEDLE) ×4 IMPLANT
NS IRRIG 1000ML POUR BTL (IV SOLUTION) ×4 IMPLANT
PACK ABDOMINAL GYN (CUSTOM PROCEDURE TRAY) ×4 IMPLANT
PAD OB MATERNITY 4.3X12.25 (PERSONAL CARE ITEMS) ×4 IMPLANT
PROTECTOR NERVE ULNAR (MISCELLANEOUS) ×4 IMPLANT
RELOAD PROXIMATE 75MM BLUE (ENDOMECHANICALS) ×4 IMPLANT
RETRACTOR WND ALEXIS 25 LRG (MISCELLANEOUS) IMPLANT
RTRCTR WOUND ALEXIS 18CM MED (MISCELLANEOUS)
RTRCTR WOUND ALEXIS 25CM LRG (MISCELLANEOUS)
SPONGE DRAIN TRACH 4X4 STRL 2S (GAUZE/BANDAGES/DRESSINGS) ×4 IMPLANT
SPONGE GAUZE 4X4 12PLY STER LF (GAUZE/BANDAGES/DRESSINGS) ×4 IMPLANT
SPONGE LAP 18X18 X RAY DECT (DISPOSABLE) ×16 IMPLANT
STAPLER PROXIMATE 75MM BLUE (STAPLE) ×4 IMPLANT
STAPLER VISISTAT 35W (STAPLE) ×4 IMPLANT
STRIP CLOSURE SKIN 1/2X4 (GAUZE/BANDAGES/DRESSINGS) IMPLANT
SUT PDS AB 0 CTX 60 (SUTURE) ×8 IMPLANT
SUT PLAIN 2 0 (SUTURE)
SUT PLAIN ABS 2-0 CT1 27XMFL (SUTURE) IMPLANT
SUT SILK 0 SH 30 (SUTURE) ×4 IMPLANT
SUT SILK 2 0 SH CR/8 (SUTURE) ×8 IMPLANT
SUT SILK 3 0 SH CR/8 (SUTURE) ×20 IMPLANT
SUT VIC AB 0 CT1 18XCR BRD8 (SUTURE) ×6 IMPLANT
SUT VIC AB 0 CT1 27 (SUTURE) ×6
SUT VIC AB 0 CT1 27XBRD ANBCTR (SUTURE) ×6 IMPLANT
SUT VIC AB 0 CT1 8-18 (SUTURE) ×6
SUT VIC AB 0 CTX 36 (SUTURE)
SUT VIC AB 0 CTX36XBRD ANBCTRL (SUTURE) IMPLANT
SUT VIC AB 3-0 CT1 27 (SUTURE) ×2
SUT VIC AB 3-0 CT1 TAPERPNT 27 (SUTURE) ×2 IMPLANT
SUT VIC AB 4-0 KS 27 (SUTURE) IMPLANT
SUT VICRYL 0 TIES 12 18 (SUTURE) ×4 IMPLANT
SYR BULB IRRIGATION 50ML (SYRINGE) ×4 IMPLANT
SYR CONTROL 10ML LL (SYRINGE) ×4 IMPLANT
TOWEL OR 17X24 6PK STRL BLUE (TOWEL DISPOSABLE) ×20 IMPLANT
TRAY FOLEY CATH 14FR (SET/KITS/TRAYS/PACK) ×4 IMPLANT
WATER STERILE IRR 1000ML POUR (IV SOLUTION) ×4 IMPLANT

## 2014-03-06 NOTE — Anesthesia Postprocedure Evaluation (Signed)
Anesthesia Post Note  Patient: Elizabeth Adkins  Procedure(s) Performed: Procedure(s) (LRB): HYSTERECTOMY ABDOMINAL (N/A) BILATERAL SALPINGECTOMY (Bilateral) COLON RESECTION SIGMOID AND REANASTOMOSIS BY HAND  (N/A)  Anesthesia type: GA  Patient location: PACU  Post pain: Pain level controlled  Post assessment: Post-op Vital signs reviewed  Last Vitals:  Filed Vitals:   03/06/14 1815  BP: 148/69  Pulse: 72  Temp:   Resp: 26    Post vital signs: Reviewed  Level of consciousness: sedated  Complications: No apparent anesthesia complications

## 2014-03-06 NOTE — Transfer of Care (Signed)
Immediate Anesthesia Transfer of Care Note  Patient: Elizabeth Adkins  Procedure(s) Performed: Procedure(s): HYSTERECTOMY ABDOMINAL (N/A) BILATERAL SALPINGECTOMY (Bilateral) COLON RESECTION SIGMOID AND REANASTOMOSIS BY HAND  (N/A)  Patient Location: PACU  Anesthesia Type:General  Level of Consciousness: awake, alert  and oriented  Airway & Oxygen Therapy: Patient Spontanous Breathing and Patient connected to nasal cannula oxygen  Post-op Assessment: Report given to PACU RN and Post -op Vital signs reviewed and stable  Post vital signs: Reviewed and stable  Complications: No apparent anesthesia complications

## 2014-03-06 NOTE — Brief Op Note (Signed)
03/06/2014  5:46 PM  PATIENT:  Elizabeth Adkins  39 y.o. female  PRE-OPERATIVE DIAGNOSIS:  cpt 58150 and 58700 - Multiple uterine fibroids and menorrhagia  POST-OPERATIVE DIAGNOSIS:  cpt 58150 and 58700 - Multiple uterine fibroids and menorrhagia; sigmoid colon serosal tear  PROCEDURE:  Procedure(s): HYSTERECTOMY ABDOMINAL (N/A) BILATERAL SALPINGECTOMY (Bilateral) COLON RESECTION SIGMOID AND REANASTOMOSIS BY HAND  (N/A)  SURGEON:  Surgeon(s) and Role: Panel 1:    * Osborne Oman, MD - Primary    * Woodroe Mode, MD - Assisting  Panel 2:    * Gayland Curry, MD - Primary  PHYSICIAN ASSISTANT: none  ASSISTANTS: Dr Harolyn Rutherford   ANESTHESIA:   general  EBL:  Total I/O In: 9935 [I.V.:4300] Out: 1300 [Urine:300; Blood:1000]  BLOOD ADMINISTERED:none  DRAINS: (18) Jackson-Pratt drain(s) with closed bulb suction in the pelvis   LOCAL MEDICATIONS USED:  NONE  SPECIMEN:  Source of Specimen:  sigmoid colon  DISPOSITION OF SPECIMEN:  PATHOLOGY  COUNTS:  YES  TOURNIQUET:  * No tourniquets in log *  DICTATION: .Other Dictation: Dictation Number [id number]  PLAN OF CARE: Admit to inpatient   PATIENT DISPOSITION:  PACU - hemodynamically stable.   Delay start of Pharmacological VTE agent (>24hrs) due to surgical blood loss or risk of bleeding: no  [name]. Redmond Pulling, MD, FACS General, Bariatric, & Minimally Invasive Surgery [facility], [place]

## 2014-03-06 NOTE — H&P (Signed)
Preoperative History and Physical  Elizabeth Adkins is a 39 y.o. G0 here for definitive surgical management of symptomatic uterine fibroids and menorrhagia. She is not interested in future childbearing.   No significant preoperative concerns.  Proposed surgery: Total abdominal hysterectomy, bilateral salpingectomy  Past Medical History  Diagnosis Date  . Schizophrenia    Past Surgical History  Procedure Laterality Date  . Thumb surgery     OB History  Gravida Para Term Preterm AB SAB TAB Ectopic Multiple Living  0 0 0 0 0 0 0 0 0 0       Patient denies any other pertinent gynecologic issues.   No current facility-administered medications on file prior to encounter.   Current Outpatient Prescriptions on File Prior to Encounter  Medication Sig Dispense Refill  . HYDROcodone-acetaminophen (NORCO/VICODIN) 5-325 MG per tablet Take 1-2 tablets by mouth every 4 (four) hours as needed for severe pain. 30 tablet 0  . ibuprofen (ADVIL,MOTRIN) 600 MG tablet Take 1 tablet (600 mg total) by mouth 4 (four) times daily as needed for mild pain or moderate pain. (Patient not taking: Reported on 02/26/2014) 60 tablet 3  . megestrol (MEGACE) 20 MG tablet Take 2 tablets (40 mg total) by mouth 2 (two) times daily. 60 tablet 5  . ondansetron (ZOFRAN-ODT) 8 MG disintegrating tablet Take 8 mg by mouth every 8 (eight) hours as needed for nausea or vomiting.     Allergies  Allergen Reactions  . Septra [Sulfamethoxazole-Trimethoprim] Other (See Comments)    "Burns from inside out"    Social History:   reports that she has been smoking Cigarettes.  She has been smoking about 1.00 pack per day. She has never used smokeless tobacco. She reports that she does not drink alcohol or use illicit drugs.  Family History  Problem Relation Age of Onset  . Diabetes Mother   . Hypertension Mother     Review of Systems: Noncontributory  PHYSICAL EXAM: Blood pressure 156/92, pulse 100, temperature 98.1 F (36.7 C),  temperature source Oral, resp. rate 20, SpO2 100 %. General appearance - alert, well appearing, and in no distress Chest - clear to auscultation, no wheezes, rales or rhonchi, symmetric air entry Heart - normal rate and regular rhythm Abdomen - Soft, globular enlarged fibroid uterus palpated about 5 cm above umbilicus, mild tenderness on palpation, nondistended Pelvic - examination not indicated Extremities - peripheral pulses normal, no pedal edema, no clubbing or cyanosis  Labs: Results for orders placed or performed during the hospital encounter of 03/06/14 (from the past 336 hour(s))  ABO/Rh   Collection Time: 02/26/14  1:30 PM  Result Value Ref Range   ABO/RH(D) O POS   Pregnancy, urine   Collection Time: 03/06/14 11:00 AM  Result Value Ref Range   Preg Test, Ur NEGATIVE NEGATIVE  Results for orders placed or performed during the hospital encounter of 02/26/14 (from the past 336 hour(s))  Type and screen   Collection Time: 02/26/14  1:30 PM  Result Value Ref Range   ABO/RH(D) O POS    Antibody Screen NEG    Sample Expiration 03/01/2014   CBC   Collection Time: 02/26/14  1:35 PM  Result Value Ref Range   WBC 6.9 4.0 - 10.5 K/uL   RBC 4.47 3.87 - 5.11 MIL/uL   Hemoglobin 16.0 (H) 12.0 - 15.0 g/dL   HCT 46.3 (H) 36.0 - 46.0 %   MCV 103.6 (H) 78.0 - 100.0 fL   MCH 35.8 (H) 26.0 - 34.0 pg  MCHC 34.6 30.0 - 36.0 g/dL   RDW 15.9 (H) 11.5 - 15.5 %   Platelets 371 150 - 400 K/uL   11/06/2013 Normal pap, negative HRHPV, GC and Chlam. Benign endometrial biopsy.   Imaging Studies: 09/14/13 TRANSABDOMINAL AND TRANSVAGINAL ULTRASOUND OF PELVIS FINDINGS: Uterus Measurements: 9.4 x 6.0 x 5.4 cm. Multiple fibroids are identified. The largest is a pedunculated subserosal fibroid arising from the posterior fundus measuring 15 x 8.7 x 13.8 cm. Within the lower uterine segment there is a subserosal fibroid measuring 3.3 x 3.2 x 5.1 cm. There is a subserosal fibroid arising from the posterior  myometrium measuring 2.5 x 2.1 x 2.5 cm. Endometrium Thickness: 6.6 mm. No focal abnormality visualized. Right ovary Measurements: 2.9 x 1.4 x 2.7 cm. Normal appearance/no adnexal mass. Left ovary  Measurements: 2.7 x 1.6 x 2.4 cm. Normal appearance/no adnexal mass. Other findings Trace free fluid. IMPRESSION: Enlarged fibroid uterus as above.  Assessment: Patient Active Problem List   Diagnosis Date Noted  . Fibroids 11/06/2013  . Menorrhagia with regular cycle 11/06/2013    Plan: Patient will undergo surgical management with total abdominal hysterectomy and bilateral salpingectomy. No indication for oophorectomy. The risks of surgery were discussed in detail with the patient including but not limited to: bleeding which may require transfusion or reoperation; infection which may require antibiotics; injury to bowel, bladder, ureters or other surrounding organs; need for additional procedures including laparotomy; thromboembolic phenomenon, incisional problems and other postoperative/anesthesia complications.  Likelihood of success in alleviating the patient's symptoms was discussed.  Patient was also advised that she will remain in house for 2 nights; and expected recovery time after a hysterectomy is 6-8 weeks.  Other postoperative instructions will be reviewed with the patient and her family in detail after surgery.  The patient concurred with the proposed plan, giving informed written consent for the surgery.  Patient has been NPO since last night she will remain NPO for procedure.  Anesthesia and OR aware.  Preoperative prophylactic antibiotics and SCDs ordered on call to the OR.  To OR when ready.  Verita Schneiders, M.D. 03/06/2014 12:36 PM

## 2014-03-06 NOTE — Progress Notes (Signed)
Theodoro Kos , patients mother and updated her on pt condition  Mother states she will be here tomorrow after work.

## 2014-03-06 NOTE — Anesthesia Procedure Notes (Signed)
Procedure Name: Intubation Date/Time: 03/06/2014 1:31 PM Performed by: Tobin Chad Pre-anesthesia Checklist: Patient identified, Timeout performed, Emergency Drugs available, Suction available and Patient being monitored Patient Re-evaluated:Patient Re-evaluated prior to inductionOxygen Delivery Method: Circle system utilized and Simple face mask Preoxygenation: Pre-oxygenation with 100% oxygen Intubation Type: IV induction Ventilation: Mask ventilation without difficulty Tube size: 7.0 mm Number of attempts: 1 Airway Equipment and Method: Stylet Placement Confirmation: ETT inserted through vocal cords under direct vision,  positive ETCO2 and breath sounds checked- equal and bilateral Secured at: 20 cm Tube secured with: Tape Dental Injury: Teeth and Oropharynx as per pre-operative assessment

## 2014-03-06 NOTE — Op Note (Signed)
Elizabeth Adkins PROCEDURE DATE: 03/06/2014  PREOPERATIVE DIAGNOSIS:  Symptomatic fibroids, menorrhagia POSTOPERATIVE DIAGNOSIS:  The same, large fibroid densely adherent to sigmoid colon, denudation of serosal layer of sigmoid colon requiring resection and reanastomosis SURGEONS:   Verita Schneiders, M.D. and Greer Pickerel, M.D. (General Surgery) ASSISTANT: Emeterio Reeve, M.D. OPERATION:  Total abdominal hysterectomy, Bilateral Salpingectomy, Partial Sigmoid Colectomy and Manual Reanastomosis via vertical incision ANESTHESIA:  General endotracheal.  INDICATIONS: The patient is a 39 y.o. G0 with the aforementioned preoperativediagnoses who desires definitive surgical management. On the preoperative visit, the risks, benefits, indications, and alternatives of the procedure were reviewed with the patient.  On the day of surgery, the risks of surgery were again discussed with the patient including but not limited to: bleeding which may require transfusion or reoperation; infection which may require antibiotics; injury to bowel, bladder, ureters or other surrounding organs; need for additional procedures; thromboembolic phenomenon, incisional problems and other postoperative/anesthesia complications. Written informed consent was obtained.    OPERATIVE FINDINGS: Large serosal fibroid (20 cm) on posterior fundal region with densely adherent sigmoid colon to the posterior aspect of the fibroid.  5 cm fibroid also noted in the anterior lower uterine segment; this was removed in order to successfully create a bladder flap.  Normal tubes and ovaries bilaterally.  ESTIMATED BLOOD LOSS: 1000 ml FLUIDS:  4300 ml of Lactated Ringers URINE OUTPUT:  300 ml of clear yellow urine. SPECIMENS:  Uterus, cervix,  bilateral fallopian tubes, segment of sigmoid colon sent to pathology COMPLICATIONS:  Sigmoid colon densely adherent to fibroid; separation of colon from fibroid resulted in loss of serosal layer of a section of the  sigmoid colon.  DESCRIPTION OF PROCEDURE: The patient received intravenous antibiotics and had sequential compression devices applied to her lower extremities while in the preoperative area.   She was taken to the operating room and placed under general anesthesia without difficulty.  The abdomen and perineum were prepped and draped in a sterile manner, and she was placed in a dorsal supine position.  A Foley catheter was inserted into the bladder and attached to constant drainage.  After an adequate timeout was performed, an infraumbilical vertical skin incision was made. This incision was taken down to the fascia using electrocautery with care given to maintain good hemostasis. The fascia was incised in the midline and the fascial incision was then extended superiorly and inferiorly using electrocautery without difficulty.  The rectus muscles were split bluntly in the midline and the peritoneum entered sharply without complication. This peritoneal incision was then extended superiorly and inferiorly with care given to prevent bowel or bladder injury.   The uterus at this point was unable to be delivered via the incision due to its large size so the incision was extended superiorly curving around the left side of the umbilicus.  At this point, the uterus was able to be delivered up out of the abdomen.  It was immediately noted that bowel was densely adherent to the posterior aspect of the large fibroid; and small region of bowel was noted to be without the serosal layer. Given concern about the integrity of the deserosalized section of bowel and possible further bowel bowel injury in trying to separate the fibroid from the densely adherent bowel, General Surgery was consulted for their expertise.  The fibroid and adherent bowel was covered with moist laparotomy sponges, and the decision was made to proceed with rest of the hysterectomy. The round ligaments on each side were clamped, suture ligated with 0  Vicryl, and transected with electrocautery allowing entry into the broad ligament. Of note, all sutures used in this procedure are 0 Vicryl unless otherwise noted. The anterior and posterior leaves of the broad ligament were separated, and the ureters were inspected to be safely away from the area of dissection bilaterally.  Adnexae were clamped on the patient's right side, cut, and doubly suture ligated. This procedure was repeated in an identical fashion on the left site allowing for both adnexa to remain in place.  Kelly clamps were placed on the mesosalpinx of the right fallopian tube, and the fallopian tube was excised.  The pedicle was then secured with a free tie.  A similar process was carried out on the left side, allowing for bilateral salpingectomy.      The lower uterine anterior fibroid was transected and a bladder flap was then created.  The bladder was then bluntly dissected off the lower uterine segment and cervix with good hemostasis noted. The uterine arteries were then skeletonized bilaterally and then clamped, cut, and doubly suture ligated with care given to prevent ureteral injury. The uterosacral ligaments were then clamped, cut, and ligated bilaterally.  Finally, the cardinal ligaments were clamped, cut, and ligated bilaterally.  Acutely curved clamps were placed across the vagina just under the cervix, and the specimen was amputated and sent to pathology. The vaginal cuff angles were closed with Heaney stiches with care given to incorporate the uterosacral-cardinal ligament pedicles on both sides. The middle of the vaginal cuff was closed with a series of interrupted figure-of-eight sutures with care given to incorporate the anterior pubocervical fascia and the posterior rectovaginal fascia.    At this point, Dr. Greer Pickerel arrived and examined the bowel adherent to the fibroid.  The fibroid was painstakingly freed from the rest of the adherent bowel.  No further bowel injury was noted.   The area of bowel with the loss of the serosal layer was identified to be a section of sigmoid colon.  After further examination, Dr. Redmond Pulling felt it was the best course of action to resect that region and reanastomose the colon. For further details of this part of the surgery, please refer to his separate operative report.  After the reanastomosis was done, proctoscopy revealed an airtight seal at the region.  The pelvis was irrigated and hemostasis was reconfirmed at all pedicles and along the pelvic sidewall. Figure of eight sutures were placed over the vaginal cuff to help with hemostasis.   An intraperitoneal JP drain was placed in the pelvis and brought out via the left lower quadrant and attached to bulb suction. A NGT was also placed.  All laparotomy sponges and instruments were removed from the abdomen. The fascia, rectus muscles and peritoneum were closed in a mass fashion using a looped 0 PDS suture in a running fashion. The skin was closed with staples. Sponge, lap, needle, and instrument counts were correct times two. The patient was taken to the recovery area awake, extubated and in stable condition.   Verita Schneiders, MD, [place] Attending Bee, [facility]

## 2014-03-06 NOTE — Brief Op Note (Signed)
03/06/2014  6:22 PM  PATIENT:  Elizabeth Adkins  39 y.o. female  PRE-OPERATIVE DIAGNOSIS:  cpt 58150 and 58700 - Multiple uterine fibroids and menorrhagia  POST-OPERATIVE DIAGNOSIS:  cpt 58150 and 58700 - Multiple uterine fibroids and menorrhagia  PROCEDURE:  Procedure(s): HYSTERECTOMY ABDOMINAL (N/A) BILATERAL SALPINGECTOMY (Bilateral) COLON RESECTION SIGMOID AND REANASTOMOSIS BY HAND  (N/A)  SURGEON:  Surgeon(s) and Role: Panel 1:    * Osborne Oman, MD - Primary    * Woodroe Mode, MD - Assisting  Panel 2:    * Gayland Curry, MD - Primary  ANESTHESIA:   general  EBL:  Total I/O In: 5035 [I.V.:4300] Out: 1300 [Urine:300; Blood:1000]  BLOOD ADMINISTERED:none  DRAINS: (10) Jackson-Pratt drain(s) with closed bulb suction in the LLQ, Nasogastric Tube and Urinary Catheter (Foley)   SPECIMEN:  Source of Specimen:  Uterus, cervix, bilateral fallopian tubes, segment of sigmoid colon  DISPOSITION OF SPECIMEN:  PATHOLOGY  COUNTS:  YES  DICTATION: .Note written in EPIC  PLAN OF CARE: Admit to inpatient   PATIENT DISPOSITION:  PACU - hemodynamically stable.   Delay start of Pharmacological VTE agent (>24hrs) due to surgical blood loss or risk of bleeding: not applicable

## 2014-03-06 NOTE — Progress Notes (Signed)
Report received from [facility].  Dr. Redmond Pulling called regarding status of NG tube and therapy. Order received for NG to low wall suction.  Upon checking placement NG was noted to be mostly in back of throat and nares.  Dr. Jillyn Hidden made aware, NG pulled and new tube reinserted by Dr. Jillyn Hidden. Portable KUB shows proper placement.  NG tube connected to suction with immediate return of thick yellowish fluid noted. Pts blood pressure noted to be elevating gradually, diastolics in the high 18'H to 100, discussed with Dr. Ermalene Postin and Dr. Harolyn Rutherford, will treat temporarily with Labetalol.  Full dose Dilaudid PCA initiated in PACU for patients complaints of pain at 8. Upon transfer to womens unit, pt calm and drifting off to sleep at frequent intervals.  Report given and transfer of patient care to womens unit.

## 2014-03-07 ENCOUNTER — Encounter (HOSPITAL_COMMUNITY): Payer: Self-pay | Admitting: *Deleted

## 2014-03-07 LAB — COMPREHENSIVE METABOLIC PANEL
ALBUMIN: 2.8 g/dL — AB (ref 3.5–5.2)
ALK PHOS: 52 U/L (ref 39–117)
ALT: 20 U/L (ref 0–35)
AST: 25 U/L (ref 0–37)
Anion gap: 5 (ref 5–15)
BUN: <5 mg/dL — ABNORMAL LOW (ref 6–23)
CO2: 28 mmol/L (ref 19–32)
Calcium: 8.1 mg/dL — ABNORMAL LOW (ref 8.4–10.5)
Chloride: 104 mEq/L (ref 96–112)
Creatinine, Ser: 0.76 mg/dL (ref 0.50–1.10)
GFR calc non Af Amer: >90 mL/min (ref >90)
GLUCOSE: 123 mg/dL — AB (ref 70–99)
Potassium: 4.1 mmol/L (ref 3.5–5.1)
SODIUM: 137 mmol/L (ref 135–145)
Total Bilirubin: 0.8 mg/dL (ref 0.3–1.2)
Total Protein: 5.4 g/dL — ABNORMAL LOW (ref 6.0–8.3)

## 2014-03-07 LAB — CBC
HCT: 34.4 % — ABNORMAL LOW (ref 36.0–46.0)
Hemoglobin: 11.8 g/dL — ABNORMAL LOW (ref 12.0–15.0)
MCH: 35.4 pg — AB (ref 26.0–34.0)
MCHC: 34.3 g/dL (ref 30.0–36.0)
MCV: 103.3 fL — AB (ref 78.0–100.0)
PLATELETS: 262 10*3/uL (ref 150–400)
RBC: 3.33 MIL/uL — ABNORMAL LOW (ref 3.87–5.11)
RDW: 15.4 % (ref 11.5–15.5)
WBC: 14.3 10*3/uL — ABNORMAL HIGH (ref 4.0–10.5)

## 2014-03-07 MED ORDER — ENOXAPARIN SODIUM 40 MG/0.4ML ~~LOC~~ SOLN
40.0000 mg | SUBCUTANEOUS | Status: DC
  Administered 2014-03-07 – 2014-03-14 (×8): 40 mg via SUBCUTANEOUS
  Filled 2014-03-07 (×8): qty 0.4

## 2014-03-07 MED ORDER — HEPARIN SODIUM (PORCINE) 5000 UNIT/ML IJ SOLN
5000.0000 [IU] | Freq: Three times a day (TID) | INTRAMUSCULAR | Status: DC
  Administered 2014-03-07: 5000 [IU] via SUBCUTANEOUS
  Filled 2014-03-07: qty 1

## 2014-03-07 NOTE — Progress Notes (Signed)
1 Day Post-Op  Subjective: C/o sore throat. Sore abdomen and back pain; IS 1100  Objective: Vital signs in last 24 hours: Temp:  [97.6 F (36.4 C)-98.4 F (36.9 C)] 97.9 F (36.6 C) (01/20 0549) Pulse Rate:  [70-100] 70 (01/20 0549) Resp:  [15-33] 16 (01/20 0549) BP: (79-158)/(62-100) 129/77 mmHg (01/20 0549) SpO2:  [66 %-100 %] 100 % (01/20 0549) Weight:  [169 lb (76.658 kg)] 169 lb (76.658 kg) (01/19 2111) Last BM Date:  (UNKNOWN)  Intake/Output from previous day: 01/19 0701 - 01/20 0700 In: 5537.3 [I.V.:5427.3; IV Piggyback:50] Out: 3762 [Urine:2050; Emesis/NG output:75; Drains:70; Blood:1000] Intake/Output this shift:    Awake, nad cta ant b/l Reg Obese, soft, mild distension, hypoBS. Dressing with some serosang drainage. Drain - [employer] Results:   Recent Labs  03/07/14 0525  WBC 14.3*  HGB 11.8*  HCT 34.4*  PLT 262   BMET  Recent Labs  03/07/14 0525  NA 137  K 4.1  CL 104  CO2 28  GLUCOSE 123*  BUN PENDING  CREATININE 0.76  CALCIUM 8.1*   PT/INR No results for input(s): LABPROT, INR in the last 72 hours. ABG No results for input(s): PHART, HCO3 in the last 72 hours.  Invalid input(s): PCO2, PO2  Studies/Results: Dg Abd Portable 1v  03/06/2014   CLINICAL DATA:  Status post hysterectomy, check nasogastric catheter placement  EXAM: PORTABLE ABDOMEN - 1 VIEW  COMPARISON:  None.  FINDINGS: A nasogastric catheter is noted within the stomach. Scattered large and small bowel gas is seen. No free air is noted. No acute abnormality is seen.  IMPRESSION: Nasogastric catheter within the stomach.   Electronically Signed   By: Inez Catalina M.D.   On: 03/06/2014 20:59    Anti-infectives: Anti-infectives    Start     Dose/Rate Route Frequency Ordered Stop   03/07/14 0200  cefoTEtan (CEFOTAN) 2 g in dextrose 5 % 50 mL IVPB     2 g100 mL/hr over 30 Minutes Intravenous Every 12 hours 03/06/14 2129 03/08/14 1359   03/06/14 0600  cefoTEtan (CEFOTAN) 2 g in  dextrose 5 % 50 mL IVPB     2 g100 mL/hr over 30 Minutes Intravenous On call to O.R. 03/05/14 1331 03/06/14 1314      Assessment/Plan: s/p Procedure(s): HYSTERECTOMY ABDOMINAL (N/A) BILATERAL SALPINGECTOMY (Bilateral) COLON RESECTION SIGMOID AND REANASTOMOSIS BY HAND  (N/A)  D/c ng tube Can have 8oz of ice chips per shift Oob, IS, ambulate Keep foley for strict i&o Antibiotic prophylaxis x 3 doses due to unprepped colon Start chemical VTE prophylaxis - subcu heparin; repeat CBC in am  [name]. Redmond Pulling, MD, FACS General, Bariatric, & Minimally Invasive Surgery [facility], [place]   LOS: 1 day    Gayland Curry 03/07/2014

## 2014-03-07 NOTE — Progress Notes (Signed)
Ur chart review completed.  

## 2014-03-07 NOTE — Op Note (Signed)
Elizabeth Adkins, DEUTSCHER NO.:  [id number]  MEDICAL RECORD NO.:  [id number]  LOCATION:  2992                          FACILITY:  [facility]  PHYSICIAN:  [name]. Redmond Pulling, MD, FACSDATE OF BIRTH:  [birth year]  DATE OF PROCEDURE:  03/06/2014 DATE OF DISCHARGE:                              OPERATIVE REPORT   PREOPERATIVE DIAGNOSIS:  Multiple uterine fibroids and menorrhagia.  POSTOPERATIVE DIAGNOSES:  Multiple uterine fibroids with menorrhagia, sigmoid colon serosal tear.  PROCEDURE:  Sigmoid colon resection.  SURGEON:  [name]. Redmond Pulling, MD, FACS  ASSISTANT SURGEON:  Quentin Angst.  ANESTHESIA:  General.  ESTIMATED BLOOD LOSS:  Less than 100 for my part.  SPECIMEN:  Sigmoid colon.  DRAINS:  Flat JP drain in the pelvis.  INDICATIONS AND DESCRIPTION FOR PROCEDURE:  The patient is a 39 year old female, who was undergoing laparotomy for uterine fibroid and was undergoing a hysterectomy and bilateral salpingo-oophorectomy this afternoon by her GYN surgeon.  While attempting to take out the uterus, it was quite obvious per her GYN surgeon that there was a section of sigmoid colon that was densely adhered to the uterine fibroid.  Mobilizing and trying to remove the uterus, a part of the sigmoid colon had a serosal injury. I was called for an intraoperative consult to assess the sigmoid colon. I had never met the patient before this, the patient was already asleep on the operating room table with an open abdomen.  On my arrival, the uterus was just attached to the sigmoid colon by small section.  Thus, part of the sigmoid colon wall had already been deserosalized.  It had a few peritoneal attachments left.  We were able to separate it with Bovie electrocautery and Metzenbaum scissors.  This freed the uterus and fallopian tubes.  We then evaluated the sigmoid colon.  There was about a 5-cm segment that had the serosa that had been de-serosalized.  I did not feel that they could  be patched without an increased risk of perforation postoperatively.  Therefore, I recommended resection of sigmoid colon. Fortunately, the patient had a redundant loop of sigmoid colon, so we Really didnot have to mobilize proximally or distally too much.  A small rent was made in the mesocolon just next to the bowel wall with a Claiborne Billings.  I then fired a GIA 75 stapler with a blue load proximal to the serosal injury, then downstream about 5 cm downstream I made another window in the mesocolon just next to the bowel wall and fired another load of the GIA 75 stapler with a blue load.  This transected and freed our specimen.  I then took down the mesentery staying close to the bowel wall in a serial fashion using Kelly's and 2-0 ties.  This freed our small section.  I felt that we could reanastomose it.  I did not think that she needed a colostomy.  She was normotensive.  She had not been on vasopressors. Although, she did not have a bowel prep - given her schizophrenia diagnosis I felt primary anastomosis would be best handled by the patient.  I felt it was safe to proceed with a primary anastomosis.  We were probably about  30 cm from anal verge, so an end-to- end stapled anastomosis was not possible.  She was supine on the operating room table.  A side-to-side staple anastomosis just could not lay appropriately.  I felt the safest thing to do was do an end-to-end hand-sewn anastomosis.  The proximal sigmoid colon and the distal sigmoid colon were aligned.  The proximal staple line was excised with electrocautery along with the distal staple line.  There was no stool contamination.  I then reapproximated the posterior wall of the colon with interrupted 3-0 silk sutures.  I then tested it and make sure there were no gaps.  I then continued to close the colon in an interrupted fashion around the corners and coming around anteriorly with interrupted 3-0 silk sutures.  I then placed 3-0 silk  Lembert sutures along the anterior row of the anastomosis.  The mesenteric defect was then reapproximated with interrupted 2-0 silk sutures.  At this point, I scrubbed out and we were able to put her in lithotomy and performed a rigid proctoscopy and was able to get air up into the upper rectum and past the anastomosis with Dr. Harolyn Rutherford to test for a leak.  Saline was placed in the pelvis.  Air was able to get past the anastomosis and nondistended.  There was no evidence of air bubbles.  The proctoscope was removed.  We then changed gowns and gloves.  I removed the electrocautery, suction irrigation and tubing.  I then placed and removed the old drapes and the Balfour and placed new drapes and new suction tubing instrument.  I then assisted her in re-inspecting the vaginal cuff and over-sewing it with 0 Vicryl.  I then ran the small bowel for a second time and found no evidence of injury.  We then reinspected the anastomosis, it was patent, it was hemostatic.  There was no evidence of bleeding.  She placed a flat Jackson-Pratt drain in the left lower quadrant and sutured to the skin with a silk and a drain was placed in the pelvis.  She then closed the abdomen with a running looped #1 PDS suture.  We irrigated subcutaneous tissue and closed the skin with skin staples.  Please see the remainder part of her operative note for details regarding the first portion of the procedure, I primarily dictated the part that I was involved with.  All needle, instrument, and sponge counts were correct x2.  There were no immediate complications.  The patient tolerated the procedure well.     [name]. Redmond Pulling, MD, FACS     EMW/MEDQ  D:  03/06/2014  T:  03/07/2014  Job:  [id number]

## 2014-03-07 NOTE — Progress Notes (Signed)
1 Day Post-Op Procedure(s) (LRB): HYSTERECTOMY ABDOMINAL (N/A) BILATERAL SALPINGECTOMY (Bilateral) COLON RESECTION SIGMOID AND REANASTOMOSIS BY HAND  (N/A)  Subjective: Patient reports incisional pain.  No flatus. No emesis. NGT discontinued this morning as per Dr. Dois Davenport orders  Objective: I have reviewed patient's vital signs, intake and output, medications and labs. Temp:  [97.6 F (36.4 C)-98.4 F (36.9 C)] 97.9 F (36.6 C) (01/20 0549) Pulse Rate:  [70-100] 70 (01/20 0549) Resp:  [15-33] 19 (01/20 0916) BP: (79-158)/(62-100) 129/77 mmHg (01/20 0549) SpO2:  [66 %-100 %] 100 % (01/20 0916) Weight:  [169 lb (76.658 kg)] 169 lb (76.658 kg) (01/19 2111)   01/19 0701 - 01/20 0700 In: 5537.3 [I.V.:5427.3; IV Piggyback:50] Out: 3195 [Urine:2050; Emesis/NG output:75; Drains:70; Blood:1000]  General: alert and no distress Resp: clear to auscultation bilaterally Cardio: regular rate and rhythm GI: normal findings: soft, abnormal findings:  hypoactive bowel sounds and incision: serous drainage present Extremities: extremities normal, atraumatic, no cyanosis or edema and Homans sign is negative, no sign of DVT Vaginal Bleeding: minimal  Results for orders placed or performed during the hospital encounter of 03/06/14 (from the past 24 hour(s))  Pregnancy, urine     Status: None   Collection Time: 03/06/14 11:00 AM  Result Value Ref Range   Preg Test, Ur NEGATIVE NEGATIVE  Prepare RBC (crossmatch)     Status: None   Collection Time: 03/06/14  2:03 PM  Result Value Ref Range   Order Confirmation ORDER PROCESSED BY BLOOD BANK   Comprehensive metabolic panel     Status: Abnormal   Collection Time: 03/07/14  5:25 AM  Result Value Ref Range   Sodium 137 135 - 145 mmol/L   Potassium 4.1 3.5 - 5.1 mmol/L   Chloride 104 96 - 112 mEq/L   CO2 28 19 - 32 mmol/L   Glucose, Bld 123 (H) 70 - 99 mg/dL   BUN <5 (L) 6 - 23 mg/dL   Creatinine, Ser 0.76 0.50 - 1.10 mg/dL   Calcium 8.1 (L) 8.4  - 10.5 mg/dL   Total Protein 5.4 (L) 6.0 - 8.3 g/dL   Albumin 2.8 (L) 3.5 - 5.2 g/dL   AST 25 0 - 37 U/L   ALT 20 0 - 35 U/L   Alkaline Phosphatase 52 39 - 117 U/L   Total Bilirubin 0.8 0.3 - 1.2 mg/dL   GFR calc non Af Amer >90 >90 mL/min   GFR calc Af Amer >90 >90 mL/min   Anion gap 5 5 - 15  CBC     Status: Abnormal   Collection Time: 03/07/14  5:25 AM  Result Value Ref Range   WBC 14.3 (H) 4.0 - 10.5 K/uL   RBC 3.33 (L) 3.87 - 5.11 MIL/uL   Hemoglobin 11.8 (L) 12.0 - 15.0 g/dL   HCT 34.4 (L) 36.0 - 46.0 %   MCV 103.3 (H) 78.0 - 100.0 fL   MCH 35.4 (H) 26.0 - 34.0 pg   MCHC 34.3 30.0 - 36.0 g/dL   RDW 15.4 11.5 - 15.5 %   Platelets 262 150 - 400 K/uL    Assessment: s/p Procedure(s): HYSTERECTOMY ABDOMINAL (N/A) BILATERAL SALPINGECTOMY (Bilateral) COLON RESECTION SIGMOID AND REANASTOMOSIS BY HAND  (N/A): stable and progressing well  Followed by General Surgery (Dr. Redmond Pulling); appreciate his input and expertise  Plan: Encourage ambulation Continue foley due to strict I&O NPO (NGT discontinued by Dr. Redmond Pulling) until improved bowel sounds and flatus Antibiotic prophylaxis x 3 doses due to unprepped colon Chemical VTE  prophylaxis changed to Lovenox 40 mg [place] daily, will recheck CBC in morning Routine postoperative care   LOS: 1 day   ANYANWU,UGONNA A, MD 03/07/2014, 9:46 AM

## 2014-03-08 LAB — BASIC METABOLIC PANEL
Anion gap: 6 (ref 5–15)
BUN: <5 mg/dL — ABNORMAL LOW (ref 6–23)
CALCIUM: 8.1 mg/dL — AB (ref 8.4–10.5)
CO2: 30 mmol/L (ref 19–32)
CREATININE: 1 mg/dL (ref 0.50–1.10)
Chloride: 103 mEq/L (ref 96–112)
GFR, EST AFRICAN AMERICAN: 82 mL/min — AB (ref >90)
GFR, EST NON AFRICAN AMERICAN: 71 mL/min — AB (ref >90)
GLUCOSE: 81 mg/dL (ref 70–99)
Potassium: 3.5 mmol/L (ref 3.5–5.1)
Sodium: 139 mmol/L (ref 135–145)

## 2014-03-08 LAB — CBC
HCT: 29.4 % — ABNORMAL LOW (ref 36.0–46.0)
Hemoglobin: 9.9 g/dL — ABNORMAL LOW (ref 12.0–15.0)
MCH: 35.6 pg — AB (ref 26.0–34.0)
MCHC: 33.7 g/dL (ref 30.0–36.0)
MCV: 105.8 fL — ABNORMAL HIGH (ref 78.0–100.0)
PLATELETS: 243 10*3/uL (ref 150–400)
RBC: 2.78 MIL/uL — ABNORMAL LOW (ref 3.87–5.11)
RDW: 15.6 % — AB (ref 11.5–15.5)
WBC: 9.7 10*3/uL (ref 4.0–10.5)

## 2014-03-08 LAB — MAGNESIUM: MAGNESIUM: 1.5 mg/dL (ref 1.5–2.5)

## 2014-03-08 NOTE — Progress Notes (Signed)
2 Days Post-Op  Subjective: Sore. Some nausea. Ambulated several times. No flatus  Objective: Vital signs in last 24 hours: Temp:  [97.7 F (36.5 C)-99.5 F (37.5 C)] 98.8 F (37.1 C) (01/21 0534) Pulse Rate:  [69-81] 81 (01/21 0534) Resp:  [15-21] 15 (01/21 0547) BP: (103-121)/(62-76) 112/75 mmHg (01/21 0534) SpO2:  [97 %-100 %] 100 % (01/21 0547) Last BM Date: 02/28/14  Intake/Output from previous day: 01/20 0701 - 01/21 0700 In: 3164.9 [P.O.:150; I.V.:2964.9; IV Piggyback:50] Out: 2561 [Urine:2500; Drains:61] Intake/Output this shift: Total I/O In: 3164.9 [P.O.:150; I.V.:2964.9; IV Piggyback:50] Out: 3846 [Urine:1350; Drains:36]  Alert, nontoxic cta b/l Reg Soft, obese, mild distension, some BS. Dressing unchanged scds  Lab Results:   Recent Labs  03/07/14 0525 03/08/14 0524  WBC 14.3* 9.7  HGB 11.8* 9.9*  HCT 34.4* 29.4*  PLT 262 243   BMET  Recent Labs  03/07/14 0525 03/08/14 0524  NA 137 139  K 4.1 3.5  CL 104 103  CO2 28 30  GLUCOSE 123* 81  BUN <5* <5*  CREATININE 0.76 1.00  CALCIUM 8.1* 8.1*   PT/INR No results for input(s): LABPROT, INR in the last 72 hours. ABG No results for input(s): PHART, HCO3 in the last 72 hours.  Invalid input(s): PCO2, PO2  Studies/Results: Dg Abd Portable 1v  03/06/2014   CLINICAL DATA:  Status post hysterectomy, check nasogastric catheter placement  EXAM: PORTABLE ABDOMEN - 1 VIEW  COMPARISON:  None.  FINDINGS: A nasogastric catheter is noted within the stomach. Scattered large and small bowel gas is seen. No free air is noted. No acute abnormality is seen.  IMPRESSION: Nasogastric catheter within the stomach.   Electronically Signed   By: Inez Catalina M.D.   On: 03/06/2014 20:59    Anti-infectives: Anti-infectives    Start     Dose/Rate Route Frequency Ordered Stop   03/07/14 0200  cefoTEtan (CEFOTAN) 2 g in dextrose 5 % 50 mL IVPB     2 g100 mL/hr over 30 Minutes Intravenous Every 12 hours 03/06/14 2129  03/08/14 0223   03/06/14 0600  cefoTEtan (CEFOTAN) 2 g in dextrose 5 % 50 mL IVPB     2 g100 mL/hr over 30 Minutes Intravenous On call to O.R. 03/05/14 1331 03/06/14 1314      Assessment/Plan: s/p Procedure(s): HYSTERECTOMY ABDOMINAL (N/A) BILATERAL SALPINGECTOMY (Bilateral) COLON RESECTION SIGMOID AND REANASTOMOSIS BY HAND  (N/A)  Can have SIPS of clears - not clear liquid diet; informed nurse Ambulate, IS, pulm toilet Foley per GYN - can come out today from my point of view Cont Lovenox. Repeat CBC in am. If Hgb cont to decrease, may need to hold but would cont today.   Elizabeth Adkins. Elizabeth Pulling, MD, FACS General, Bariatric, & Minimally Invasive Surgery [facility], [place]   LOS: 2 days    Elizabeth Adkins 03/08/2014

## 2014-03-08 NOTE — Progress Notes (Signed)
2 Days Post-Op Procedure(s) (LRB): HYSTERECTOMY ABDOMINAL (N/A) BILATERAL SALPINGECTOMY (Bilateral) COLON RESECTION SIGMOID AND REANASTOMOSIS BY HAND  (N/A)  Subjective: Patient reports incisional pain.  No flatus. No emesis. OOB several times.  Objective: I have reviewed patient's vital signs, intake and output, medications and labs. Temp:  [97.7 F (36.5 C)-99.5 F (37.5 C)] 99.4 F (37.4 C) (01/21 1000) Pulse Rate:  [69-86] 86 (01/21 1000) Resp:  [15-21] 18 (01/21 1000) BP: (103-119)/(62-76) 110/74 mmHg (01/21 1000) SpO2:  [97 %-100 %] 100 % (01/21 1000)  Intake/Output from previous day: 01/20 0701 - 01/21 0700 In: 3164.9 [P.O.:150; I.V.:2964.9; IV Piggyback:50] Out: 2561 [Urine:2500; Drains:61]  General: alert and no distress Resp: clear to auscultation bilaterally Cardio: regular rate and rhythm GI: normal findings: soft, abnormal findings:  hypoactive bowel sounds and incision: serous drainage present Extremities: extremities normal, atraumatic, no cyanosis or edema and Homans sign is negative, no sign of DVT Vaginal Bleeding: minimal  Results for orders placed or performed during the hospital encounter of 03/06/14 (from the past 24 hour(s))  Basic metabolic panel     Status: Abnormal   Collection Time: 03/08/14  5:24 AM  Result Value Ref Range   Sodium 139 135 - 145 mmol/L   Potassium 3.5 3.5 - 5.1 mmol/L   Chloride 103 96 - 112 mEq/L   CO2 30 19 - 32 mmol/L   Glucose, Bld 81 70 - 99 mg/dL   BUN <5 (L) 6 - 23 mg/dL   Creatinine, Ser 1.00 0.50 - 1.10 mg/dL   Calcium 8.1 (L) 8.4 - 10.5 mg/dL   GFR calc non Af Amer 71 (L) >90 mL/min   GFR calc Af Amer 82 (L) >90 mL/min   Anion gap 6 5 - 15  Magnesium     Status: None   Collection Time: 03/08/14  5:24 AM  Result Value Ref Range   Magnesium 1.5 1.5 - 2.5 mg/dL  CBC     Status: Abnormal   Collection Time: 03/08/14  5:24 AM  Result Value Ref Range   WBC 9.7 4.0 - 10.5 K/uL   RBC 2.78 (L) 3.87 - 5.11 MIL/uL   Hemoglobin 9.9 (L) 12.0 - 15.0 g/dL   HCT 29.4 (L) 36.0 - 46.0 %   MCV 105.8 (H) 78.0 - 100.0 fL   MCH 35.6 (H) 26.0 - 34.0 pg   MCHC 33.7 30.0 - 36.0 g/dL   RDW 15.6 (H) 11.5 - 15.5 %   Platelets 243 150 - 400 K/uL    Assessment: s/p Procedure(s): HYSTERECTOMY ABDOMINAL (N/A) BILATERAL SALPINGECTOMY (Bilateral) COLON RESECTION SIGMOID AND REANASTOMOSIS BY HAND  (N/A): stable and progressing well  Followed by General Surgery (Dr. Redmond Pulling); appreciate his input and expertise  Plan: Encourage ambulation Discontinue foley Sips of clears as per General Surgery Continue Lovenox 40 mg [place] daily, will recheck CBC in morning Routine postoperative care   LOS: 2 days   Winfrey Chillemi A, MD 03/08/2014, 1:33 PM

## 2014-03-09 LAB — CBC
HEMATOCRIT: 29.8 % — AB (ref 36.0–46.0)
HEMOGLOBIN: 10.3 g/dL — AB (ref 12.0–15.0)
MCH: 36.1 pg — AB (ref 26.0–34.0)
MCHC: 34.6 g/dL (ref 30.0–36.0)
MCV: 104.6 fL — AB (ref 78.0–100.0)
PLATELETS: 268 10*3/uL (ref 150–400)
RBC: 2.85 MIL/uL — AB (ref 3.87–5.11)
RDW: 15.2 % (ref 11.5–15.5)
WBC: 13.1 10*3/uL — ABNORMAL HIGH (ref 4.0–10.5)

## 2014-03-09 NOTE — Progress Notes (Signed)
3 Days Post-Op  Subjective: No flatus. No n/v. Sore when walks  Objective: Vital signs in last 24 hours: Temp:  [98.2 F (36.8 C)-99.4 F (37.4 C)] 98.2 F (36.8 C) (01/22 0529) Pulse Rate:  [81-86] 85 (01/22 0529) Resp:  [17-22] 19 (01/22 0529) BP: (110-125)/(65-77) 124/77 mmHg (01/22 0529) SpO2:  [100 %] 100 % (01/22 0529) Last BM Date:  (pt states about 2 weeks ago)  Intake/Output from previous day: 01/21 0701 - 01/22 0700 In: 3316.3 [P.O.:360; I.V.:2956.3] Out: 2532.5 [Urine:2450; Drains:82.5] Intake/Output this shift:    Alert, nad cta  Reg Obese, soft, approp TTP. Dressing -some serosang drainage.   Lab Results:   Recent Labs  03/08/14 0524 03/09/14 0523  WBC 9.7 13.1*  HGB 9.9* 10.3*  HCT 29.4* 29.8*  PLT 243 268   BMET  Recent Labs  03/07/14 0525 03/08/14 0524  NA 137 139  K 4.1 3.5  CL 104 103  CO2 28 30  GLUCOSE 123* 81  BUN <5* <5*  CREATININE 0.76 1.00  CALCIUM 8.1* 8.1*   PT/INR No results for input(s): LABPROT, INR in the last 72 hours. ABG No results for input(s): PHART, HCO3 in the last 72 hours.  Invalid input(s): PCO2, PO2  Studies/Results: No results found.  Anti-infectives: Anti-infectives    Start     Dose/Rate Route Frequency Ordered Stop   03/07/14 0200  cefoTEtan (CEFOTAN) 2 g in dextrose 5 % 50 mL IVPB     2 g100 mL/hr over 30 Minutes Intravenous Every 12 hours 03/06/14 2129 03/08/14 0223   03/06/14 0600  cefoTEtan (CEFOTAN) 2 g in dextrose 5 % 50 mL IVPB     2 g100 mL/hr over 30 Minutes Intravenous On call to O.R. 03/05/14 1331 03/06/14 1314      Assessment/Plan: s/p Procedure(s): HYSTERECTOMY ABDOMINAL (N/A) BILATERAL SALPINGECTOMY (Bilateral) COLON RESECTION SIGMOID AND REANASTOMOSIS BY HAND  (N/A)  Can start clears. Told pt to take it slow Cont PCA until bowel function returns IS, ambulate Cont VTE prophylaxis -hgb stable.   [name]. Redmond Pulling, MD, FACS General, Bariatric, & Minimally Invasive  Surgery [facility], [place]   LOS: 3 days    Gayland Curry 03/09/2014

## 2014-03-09 NOTE — Progress Notes (Signed)
3 Days Post-Op Procedure(s) (LRB): HYSTERECTOMY ABDOMINAL (N/A) BILATERAL SALPINGECTOMY (Bilateral) COLON RESECTION SIGMOID AND REANASTOMOSIS BY HAND  (N/A)  Subjective: Patient reports incisional pain and no problems voiding.  No flatus  Objective: I have reviewed patient's vital signs, intake and output and medications.  General: alert, cooperative and no distress Resp: normal effort GI: incision: clean and intact and   Extremities: extremities normal, atraumatic, no cyanosis or edema  Assessment: s/p Procedure(s): HYSTERECTOMY ABDOMINAL (N/A) BILATERAL SALPINGECTOMY (Bilateral) COLON RESECTION SIGMOID AND REANASTOMOSIS BY HAND  (N/A): ileus present  Plan: Encourage ambulation NPO  LOS: 3 days    ARNOLD,JAMES 03/09/2014, 6:50 AM

## 2014-03-10 MED ORDER — PANTOPRAZOLE SODIUM 40 MG PO TBEC
40.0000 mg | DELAYED_RELEASE_TABLET | Freq: Every day | ORAL | Status: DC
  Administered 2014-03-10 – 2014-03-14 (×5): 40 mg via ORAL
  Filled 2014-03-10 (×5): qty 1

## 2014-03-10 MED ORDER — HYDROMORPHONE HCL 2 MG PO TABS
2.0000 mg | ORAL_TABLET | ORAL | Status: DC | PRN
  Administered 2014-03-10 – 2014-03-12 (×9): 2 mg via ORAL
  Filled 2014-03-10 (×9): qty 1

## 2014-03-10 NOTE — Progress Notes (Addendum)
Patient stated passing flatus, large amount at 2158 x 2

## 2014-03-10 NOTE — Progress Notes (Signed)
4 Days Post-Op Procedure(s) (LRB): HYSTERECTOMY ABDOMINAL (N/A) BILATERAL SALPINGECTOMY (Bilateral) COLON RESECTION SIGMOID AND REANASTOMOSIS BY HAND  (N/A)  Subjective: Patient reports incisional pain and + flatus.  Pain with walking and coughing.  Passed flatus x2 overnight.  Objective: I have reviewed patient's vital signs, intake and output and labs.  Filed Vitals:   03/09/14 2156 03/10/14 0122 03/10/14 0502 03/10/14 0513  BP: 132/76 126/73  120/83  Pulse: 102 103  100  Temp: 100.1 F (37.8 C) 100.3 F (37.9 C)  100 F (37.8 C)  TempSrc: Oral Oral  Oral  Resp:  32 25 25  Height:      Weight:      SpO2: 96% 94% 97% 97%   General: alert, cooperative and no distress Resp: clear to auscultation bilaterally Cardio: regular rate and rhythm GI: moderate tenderness, distended wtih gas, no rebound Extremities: Homans sign is negative, no sign of DVT +BS in all 4 quadrants  Assessment: s/p Procedure(s): HYSTERECTOMY ABDOMINAL (N/A) BILATERAL SALPINGECTOMY (Bilateral) COLON RESECTION SIGMOID AND REANASTOMOSIS BY HAND  (N/A): stable  Plan: Encourage ambulation Clear liquids  T max 100.3; will watch carefully--will defer to general surgery to restart antibiotics Need to increase incentive spirometry use and coughing.  LOS: 4 days    LEGGETT,KELLY H. 03/10/2014, 6:24 AM

## 2014-03-10 NOTE — Progress Notes (Signed)
4 Days Post-Op  Subjective: Passing flatus  No BM sore  Objective: Vital signs in last 24 hours: Temp:  [99.3 F (37.4 C)-100.3 F (37.9 C)] 99.5 F (37.5 C) (01/23 0605) Pulse Rate:  [94-103] 100 (01/23 0513) Resp:  [15-32] 25 (01/23 0513) BP: (120-132)/(73-83) 120/83 mmHg (01/23 0513) SpO2:  [94 %-99 %] 97 % (01/23 0513) Last BM Date:  (pt states about 2 weeks ago)  Intake/Output from previous day: 01/22 0701 - 01/23 0700 In: 3487.3 [P.O.:510; I.V.:2977.3] Out: 2886 [Urine:2325; Drains:561] Intake/Output this shift:    Incision/Wound: intact and clean JP serous  Obese soft  Lab Results:   Recent Labs  03/08/14 0524 03/09/14 0523  WBC 9.7 13.1*  HGB 9.9* 10.3*  HCT 29.4* 29.8*  PLT 243 268   BMET  Recent Labs  03/08/14 0524  NA 139  K 3.5  CL 103  CO2 30  GLUCOSE 81  BUN <5*  CREATININE 1.00  CALCIUM 8.1*   PT/INR No results for input(s): LABPROT, INR in the last 72 hours. ABG No results for input(s): PHART, HCO3 in the last 72 hours.  Invalid input(s): PCO2, PO2  Studies/Results: No results found.  Anti-infectives: Anti-infectives    Start     Dose/Rate Route Frequency Ordered Stop   03/07/14 0200  cefoTEtan (CEFOTAN) 2 g in dextrose 5 % 50 mL IVPB     2 g100 mL/hr over 30 Minutes Intravenous Every 12 hours 03/06/14 2129 03/08/14 0223   03/06/14 0600  cefoTEtan (CEFOTAN) 2 g in dextrose 5 % 50 mL IVPB     2 g100 mL/hr over 30 Minutes Intravenous On call to O.R. 03/05/14 1331 03/06/14 1314      Assessment/Plan: s/p Procedure(s): HYSTERECTOMY ABDOMINAL (N/A) BILATERAL SALPINGECTOMY (Bilateral) COLON RESECTION SIGMOID AND REANASTOMOSIS BY HAND  (N/A) Full  Liquid diet  Ambulate  Advance diet after BM and can be discharged at that point  LOS: 4 days    Amillion Scobee A. 03/10/2014

## 2014-03-11 MED ORDER — POLYETHYLENE GLYCOL 3350 17 G PO PACK
17.0000 g | PACK | Freq: Every day | ORAL | Status: DC
  Administered 2014-03-11 – 2014-03-14 (×4): 17 g via ORAL
  Filled 2014-03-11 (×5): qty 1

## 2014-03-11 MED ORDER — BISACODYL 10 MG RE SUPP
10.0000 mg | Freq: Once | RECTAL | Status: AC
  Administered 2014-03-11: 10 mg via RECTAL
  Filled 2014-03-11: qty 1

## 2014-03-11 NOTE — Progress Notes (Signed)
5 Days Post-Op  Subjective: PT SORE  Objective: Vital signs in last 24 hours: Temp:  [98.3 F (36.8 C)-99.6 F (37.6 C)] 99.4 F (37.4 C) (01/24 0953) Pulse Rate:  [101-109] 103 (01/24 0600) Resp:  [18-20] 18 (01/24 0600) BP: (110-124)/(67-75) 110/71 mmHg (01/24 0600) SpO2:  [97 %-100 %] 97 % (01/24 0600) Last BM Date: 03/06/14  Intake/Output from previous day: 01/23 0701 - 01/24 0700 In: -  Out: 520 [Urine:450; Drains:70] Intake/Output this shift:    Incision/Wound:CDI DRESSING REMOVED  JP  SEROUS.   Lab Results:   Recent Labs  03/09/14 0523  WBC 13.1*  HGB 10.3*  HCT 29.8*  PLT 268   BMET No results for input(s): NA, K, CL, CO2, GLUCOSE, BUN, CREATININE, CALCIUM in the last 72 hours. PT/INR No results for input(s): LABPROT, INR in the last 72 hours. ABG No results for input(s): PHART, HCO3 in the last 72 hours.  Invalid input(s): PCO2, PO2  Studies/Results: No results found.  Anti-infectives: Anti-infectives    Start     Dose/Rate Route Frequency Ordered Stop   03/07/14 0200  cefoTEtan (CEFOTAN) 2 g in dextrose 5 % 50 mL IVPB     2 g100 mL/hr over 30 Minutes Intravenous Every 12 hours 03/06/14 2129 03/08/14 0223   03/06/14 0600  cefoTEtan (CEFOTAN) 2 g in dextrose 5 % 50 mL IVPB     2 g100 mL/hr over 30 Minutes Intravenous On call to O.R. 03/05/14 1331 03/06/14 1314      Assessment/Plan: s/p Procedure(s): HYSTERECTOMY ABDOMINAL (N/A) BILATERAL SALPINGECTOMY (Bilateral) COLON RESECTION SIGMOID AND REANASTOMOSIS BY HAND  (N/A) Once pt has BM can be discharged.  JP can be roved as well after BM.  ADVANCE DIET  LOS: 5 days    Elizabeth Adkins A. 03/11/2014

## 2014-03-11 NOTE — Progress Notes (Signed)
5 Days Post-Op Procedure(s) (LRB): HYSTERECTOMY ABDOMINAL (N/A) BILATERAL SALPINGECTOMY (Bilateral) COLON RESECTION SIGMOID AND REANASTOMOSIS BY HAND  (N/A)  Subjective: Patient reports incisional pain, tolerating PO, + flatus and no problems voiding.    Objective: I have reviewed patient's intake and output, medications and labs. Temp:  [98.3 F (36.8 C)-99.6 F (37.6 C)] 99.3 F (37.4 C) (01/24 0600) Pulse Rate:  [101-109] 103 (01/24 0600) Resp:  [18-20] 18 (01/24 0600) BP: (110-124)/(67-75) 110/71 mmHg (01/24 0600) SpO2:  [97 %-100 %] 97 % (01/24 0600)  General: alert, cooperative and no distress Resp: clear to auscultation bilaterally GI: soft, non-tender; bowel sounds normal; no masses,  no organomegaly, incision: dry and intact and dressing in place with no new drainage JP drain clear. .  Assessment: s/p Procedure(s): HYSTERECTOMY ABDOMINAL (N/A) BILATERAL SALPINGECTOMY (Bilateral) COLON RESECTION SIGMOID AND REANASTOMOSIS BY HAND  (N/A): stable and progressing well  Plan: Dulcolax suppos x 1 today Keep on full liquids til BM Check cbc now JP drain management as per gen surgery  LOS: 5 days    Elizabeth Adkins V 03/11/2014, 8:59 AM

## 2014-03-12 MED ORDER — OXYCODONE-ACETAMINOPHEN 5-325 MG PO TABS
1.0000 | ORAL_TABLET | ORAL | Status: DC | PRN
  Administered 2014-03-12: 1.5 via ORAL
  Administered 2014-03-12: 2 via ORAL
  Administered 2014-03-12: 1 via ORAL
  Administered 2014-03-13 – 2014-03-14 (×4): 2 via ORAL
  Administered 2014-03-14 (×2): 1 via ORAL
  Administered 2014-03-15: 2 via ORAL
  Administered 2014-03-15: 1 via ORAL
  Filled 2014-03-12: qty 1
  Filled 2014-03-12: qty 2
  Filled 2014-03-12: qty 1
  Filled 2014-03-12 (×3): qty 2
  Filled 2014-03-12: qty 1
  Filled 2014-03-12 (×2): qty 2
  Filled 2014-03-12: qty 1
  Filled 2014-03-12: qty 2

## 2014-03-12 MED ORDER — BISACODYL 10 MG RE SUPP
10.0000 mg | Freq: Every day | RECTAL | Status: DC
  Administered 2014-03-12 – 2014-03-13 (×2): 10 mg via RECTAL
  Filled 2014-03-12 (×3): qty 1

## 2014-03-12 MED ORDER — IBUPROFEN 600 MG PO TABS
600.0000 mg | ORAL_TABLET | Freq: Four times a day (QID) | ORAL | Status: DC | PRN
  Administered 2014-03-12 – 2014-03-15 (×6): 600 mg via ORAL
  Filled 2014-03-12 (×8): qty 1

## 2014-03-12 NOTE — Progress Notes (Signed)
6 Days Post-Op Procedure(s) (LRB): HYSTERECTOMY ABDOMINAL (N/A) BILATERAL SALPINGECTOMY (Bilateral) COLON RESECTION SIGMOID AND REANASTOMOSIS BY HAND  (N/A)  Subjective: Patient reports incisional pain, tolerating PO, + flatus and very small BM in response to laxative given to her yesterday.  She ate a grilled cheese sandwich yesterday without any problems.  Objective: I have reviewed patient's intake and output, medications and labs. Temp:  [98.1 F (36.7 C)-100.1 F (37.8 C)] 100 F (37.8 C) (01/25 0550) Pulse Rate:  [95-103] 95 (01/25 0550) Resp:  [16-20] 16 (01/25 0550) BP: (116-120)/(69-75) 117/74 mmHg (01/25 0550) SpO2:  [98 %-100 %] 98 % (01/25 0550)  General: alert, cooperative and no distress Resp: clear to auscultation bilaterally GI: soft, non-tender; bowel sounds normal; no masses,  no organomegaly, incision: dry and intact and dressing in place with no new drainage JP drain clear. .  Assessment: s/p Procedure(s): HYSTERECTOMY ABDOMINAL (N/A) BILATERAL SALPINGECTOMY (Bilateral) COLON RESECTION SIGMOID AND REANASTOMOSIS BY HAND  (N/A): stable and progressing well  Plan: Continue diet advancement as per General Surgery, appreciate their input. Will also defer to their expertise as per patient disposition. Routine postoperative care    LOS: 6 days   ANYANWU,UGONNA A, MD 03/12/2014, 8:34 AM

## 2014-03-12 NOTE — Progress Notes (Signed)
6 Days Post-Op  Subjective: She ate a grilled cheese because they told her she couldn't have meat.  She wanted a hot dog.  She had a small hard rounded stool.  No nausea with her PO's so far.  Her IV is out.  Objective: Vital signs in last 24 hours: Temp:  [98.1 F (36.7 C)-100.1 F (37.8 C)] 100 F (37.8 C) (01/25 0550) Pulse Rate:  [95-103] 95 (01/25 0550) Resp:  [16-20] 16 (01/25 0550) BP: (116-120)/(69-75) 117/74 mmHg (01/25 0550) SpO2:  [98 %-100 %] 98 % (01/25 0550) Last BM Date: 03/11/14 (small, formed) 135 from the drain 1 stool PO not recorded; soft diet Afebrile, VSS WBC is up 13.1 Intake/Output from previous day: 01/24 0701 - 01/25 0700 In: -  Out: 136 [Drains:135; Stool:1] Intake/Output this shift:    General appearance: alert, cooperative and no distress Resp: clear to auscultation bilaterally GI: soft, sore, few BS, incision looks fine.  Drain is serous fluid.  Lab Results:  No results for input(s): WBC, HGB, HCT, PLT in the last 72 hours.  BMET No results for input(s): NA, K, CL, CO2, GLUCOSE, BUN, CREATININE, CALCIUM in the last 72 hours. PT/INR No results for input(s): LABPROT, INR in the last 72 hours.   Recent Labs Lab 03/07/14 0525  AST 25  ALT 20  ALKPHOS 52  BILITOT 0.8  PROT 5.4*  ALBUMIN 2.8*     Lipase  No results found for: LIPASE   Studies/Results: No results found.  Medications: . enoxaparin (LOVENOX) injection  40 mg Subcutaneous Q24H  . pantoprazole  40 mg Oral QHS  . polyethylene glycol  17 g Oral Daily    Assessment/Plan 1.  Multiple uterine fibroids and menorrhagia S/p HYSTERECTOMY ABDOMINAL, BILATERAL SALPINGECTOMY (Bilateral) COLON RESECTION SIGMOID AND REANASTOMOSIS BY HAND, 03/06/2014, Elizabeth Oman, MD 2.  Multiple uterine fibroids with menorrhagia, sigmoid colon serosal tear, with  Sigmoid colon resection,Dr. Randall Hiss Adkins,03/07/14. 3.  Lovenox for DVT prophylaxis.   Plan:   I am going to change her from  dilaudid PO to percocet.  Add ibuprofen, walk her more, and see how she does.  I think we an pull drain,but I will wait till Dr. Barry Dienes see's patient.  Recheck her labs in AM, I don't know why WBC is up.      LOS: 6 days    Elizabeth Adkins 03/12/2014

## 2014-03-12 NOTE — Progress Notes (Signed)
Ur chart review completed.  

## 2014-03-13 DIAGNOSIS — Z9049 Acquired absence of other specified parts of digestive tract: Secondary | ICD-10-CM

## 2014-03-13 LAB — CBC
HEMATOCRIT: 25.5 % — AB (ref 36.0–46.0)
Hemoglobin: 8.8 g/dL — ABNORMAL LOW (ref 12.0–15.0)
MCH: 35.3 pg — AB (ref 26.0–34.0)
MCHC: 34.5 g/dL (ref 30.0–36.0)
MCV: 102.4 fL — ABNORMAL HIGH (ref 78.0–100.0)
Platelets: 437 10*3/uL — ABNORMAL HIGH (ref 150–400)
RBC: 2.49 MIL/uL — ABNORMAL LOW (ref 3.87–5.11)
RDW: 15.2 % (ref 11.5–15.5)
WBC: 9.3 10*3/uL (ref 4.0–10.5)

## 2014-03-13 LAB — MAGNESIUM: Magnesium: 1.9 mg/dL (ref 1.5–2.5)

## 2014-03-13 LAB — TYPE AND SCREEN
ABO/RH(D): O POS
Antibody Screen: NEGATIVE
UNIT DIVISION: 0
Unit division: 0
Unit division: 0

## 2014-03-13 LAB — BASIC METABOLIC PANEL
ANION GAP: 6 (ref 5–15)
BUN: <5 mg/dL — ABNORMAL LOW (ref 6–23)
CHLORIDE: 102 mmol/L (ref 96–112)
CO2: 29 mmol/L (ref 19–32)
Calcium: 7.5 mg/dL — ABNORMAL LOW (ref 8.4–10.5)
Creatinine, Ser: 0.72 mg/dL (ref 0.50–1.10)
GFR calc Af Amer: >90 mL/min (ref >90)
GFR calc non Af Amer: >90 mL/min (ref >90)
Glucose, Bld: 87 mg/dL (ref 70–99)
POTASSIUM: 2.8 mmol/L — AB (ref 3.5–5.1)
Sodium: 137 mmol/L (ref 135–145)

## 2014-03-13 MED ORDER — MAGNESIUM OXIDE 400 (241.3 MG) MG PO TABS
400.0000 mg | ORAL_TABLET | Freq: Two times a day (BID) | ORAL | Status: DC
  Administered 2014-03-13: 400 mg via ORAL
  Filled 2014-03-13 (×3): qty 1

## 2014-03-13 MED ORDER — FLEET ENEMA 7-19 GM/118ML RE ENEM
1.0000 | ENEMA | Freq: Every day | RECTAL | Status: AC | PRN
  Administered 2014-03-13: 1 via RECTAL

## 2014-03-13 MED ORDER — POTASSIUM CHLORIDE CRYS ER 20 MEQ PO TBCR
20.0000 meq | EXTENDED_RELEASE_TABLET | Freq: Three times a day (TID) | ORAL | Status: DC
  Administered 2014-03-13 – 2014-03-14 (×6): 20 meq via ORAL
  Filled 2014-03-13 (×8): qty 1

## 2014-03-13 NOTE — Progress Notes (Signed)
7 Days Post-Op Procedure(s) (LRB): HYSTERECTOMY ABDOMINAL (N/A) BILATERAL SALPINGECTOMY (Bilateral) COLON RESECTION SIGMOID AND REANASTOMOSIS BY HAND  (N/A)  Subjective: Patient is ambulating without difficulty and has tolerated a regular diet. Had more flatus but no further BM since 03/11/14 despite suppository administration yesterday. Due to receive another dose today.  Objective: I have reviewed patient's intake and output, medications and labs. Temp:  [98.1 F (36.7 C)-99.5 F (37.5 C)] 98.2 F (36.8 C) (01/26 0549) Pulse Rate:  [83-98] 83 (01/26 0549) Resp:  [16-18] 16 (01/26 0549) BP: (101-118)/(59-68) 108/60 mmHg (01/26 0549) SpO2:  [97 %-100 %] 100 % (01/26 0549)  01/25 0701 - 01/26 0700 In: 30  Out: 120 [Drains:120]   JP output over past four days: 561, 520, 136, 120  General: alert, cooperative and no distress Resp: clear to auscultation bilaterally GI: soft, non-tender; bowel sounds normal; no masses, no organomegaly, JP drain with serous drainage.  Incision: dry and intact with staples; 5 cm firm subcutaneous mass palpated under superior aspect of incision.  No erythema but mildly tender on palpation.  CBC Latest Ref Rng 03/13/2014 03/09/2014 03/08/2014  WBC 4.0 - 10.5 K/uL 9.3 13.1(H) 9.7  Hemoglobin 12.0 - 15.0 g/dL 8.8(L) 10.3(L) 9.9(L)  Hematocrit 36.0 - 46.0 % 25.5(L) 29.8(L) 29.4(L)  Platelets 150 - 400 K/uL 437(H) 268 243   BMP Latest Ref Rng 03/13/2014 03/08/2014 03/07/2014  Glucose 70 - 99 mg/dL 87 81 123(H)  BUN 6 - 23 mg/dL <5(L) <5(L) <5(L)  Creatinine 0.50 - 1.10 mg/dL 0.72 1.00 0.76  Sodium 135 - 145 mmol/L 137 139 137  Potassium 3.5 - 5.1 mmol/L 2.8(L) 3.5 4.1  Chloride 96 - 112 mmol/L 102 103 104  CO2 19 - 32 mmol/L 29 30 28   Calcium 8.4 - 10.5 mg/dL 7.5(L) 8.1(L) 8.1(L)   Assessment: s/p Procedure(s): HYSTERECTOMY ABDOMINAL (N/A) BILATERAL SALPINGECTOMY (Bilateral) COLON RESECTION SIGMOID AND REANASTOMOSIS BY HAND  (N/A): stable and progressing  well  Plan: Continue diet advancement as per General Surgery, appreciate their input. Will also defer to their expertise as per patient disposition. Concerned about JP removal when output is still >100 ml/day, will await General Surgery recommendations Will evaluate response to suppository today K repletion as per order by General Surgery, will recheck labs if patient is still here tomorrow Will monitor incisional subcutaneous mass for now, no signs of infection. Of note, benign surgical pathology discussed with patient. Routine postoperative care    LOS: 7 days   Bazil Dhanani A, MD 03/13/2014, 9:20 AM

## 2014-03-13 NOTE — Progress Notes (Signed)
Very small formed BM noted after recieving fleets enema a t 2130. Will continue to monitor through the night

## 2014-03-13 NOTE — Progress Notes (Signed)
7 Days Post-Op  Subjective: Still no BM, tolerating regular diet, but fill quickly.  K+ is low and I am replacing that.  Drain is clear and we will pull that today.  If no BM with Suppository I will try a Fleets enema after supper.  Objective: Vital signs in last 24 hours: Temp:  [98.1 F (36.7 C)-98.6 F (37 C)] 98.2 F (36.8 C) (01/26 0549) Pulse Rate:  [83-84] 83 (01/26 0549) Resp:  [16-18] 16 (01/26 0549) BP: (101-116)/(59-68) 108/60 mmHg (01/26 0549) SpO2:  [100 %] 100 % (01/26 0549) Last BM Date: 03/11/14 Drain 120  Regular diet Afebrile, VSS Hypokalemia Mild anemia Intake/Output from previous day: 01/25 0701 - 01/26 0700 In: 30  Out: 120 [Drains:120] Intake/Output this shift:    General appearance: alert, cooperative and no distress GI: soft sore, + BS, incision looks fine, + flatus, drain is clear serous.  Lab Results:   Recent Labs  03/13/14 0525  WBC 9.3  HGB 8.8*  HCT 25.5*  PLT 437*    BMET  Recent Labs  03/13/14 0525  NA 137  K 2.8*  CL 102  CO2 29  GLUCOSE 87  BUN <5*  CREATININE 0.72  CALCIUM 7.5*   PT/INR No results for input(s): LABPROT, INR in the last 72 hours.   Recent Labs Lab 03/07/14 0525  AST 25  ALT 20  ALKPHOS 52  BILITOT 0.8  PROT 5.4*  ALBUMIN 2.8*     Lipase  No results found for: LIPASE   Studies/Results: No results found.  Medications: . bisacodyl  10 mg Rectal Daily  . enoxaparin (LOVENOX) injection  40 mg Subcutaneous Q24H  . pantoprazole  40 mg Oral QHS  . polyethylene glycol  17 g Oral Daily  . potassium chloride  20 mEq Oral TID PC    Assessment/Plan 1. Multiple uterine fibroids and menorrhagia S/p HYSTERECTOMY ABDOMINAL, BILATERAL SALPINGECTOMY (Bilateral) COLON RESECTION SIGMOID AND REANASTOMOSIS BY HAND, 03/06/2014, Osborne Oman, MD 2. Multiple uterine fibroids with menorrhagia, sigmoid colon serosal tear, with  Sigmoid colon resection,Dr. Randall Hiss Wilson,03/07/14. 3. Lovenox for DVT  prophylaxis. 4.  Hypokalemia     Plan:  Replace K+, mag is 1.9, pull drain, and fleets enema if no results after supper.  LOS: 7 days    Elizabeth Adkins 03/13/2014

## 2014-03-14 DIAGNOSIS — T8149XA Infection following a procedure, other surgical site, initial encounter: Secondary | ICD-10-CM | POA: Diagnosis not present

## 2014-03-14 LAB — BASIC METABOLIC PANEL
Anion gap: 5 (ref 5–15)
CHLORIDE: 104 mmol/L (ref 96–112)
CO2: 27 mmol/L (ref 19–32)
Calcium: 7.6 mg/dL — ABNORMAL LOW (ref 8.4–10.5)
Creatinine, Ser: 0.7 mg/dL (ref 0.50–1.10)
GFR calc Af Amer: >90 mL/min (ref >90)
GLUCOSE: 92 mg/dL (ref 70–99)
Potassium: 3.6 mmol/L (ref 3.5–5.1)
SODIUM: 136 mmol/L (ref 135–145)

## 2014-03-14 MED ORDER — SORBITOL 70 % SOLN
960.0000 mL | TOPICAL_OIL | Freq: Once | ORAL | Status: AC
  Administered 2014-03-14: 960 mL via RECTAL
  Filled 2014-03-14 (×2): qty 240

## 2014-03-14 MED ORDER — DOXYCYCLINE HYCLATE 100 MG PO TABS
100.0000 mg | ORAL_TABLET | Freq: Two times a day (BID) | ORAL | Status: DC
  Administered 2014-03-14 – 2014-03-15 (×3): 100 mg via ORAL
  Filled 2014-03-14 (×3): qty 1

## 2014-03-14 NOTE — Progress Notes (Signed)
[facility] Progress Note  8 Days Post-Op  Subjective: Pt says she feels awful, says she's having pain and low grade temps.  Had a tiny BM, but no result from enema/laxatives yet.  Ambulating, but very tired.  Using IS.  Says where her drain was removed is leaking.  Says drainage from midline wound was noted after removing staples.  Denies wound opening up.  Objective: Vital signs in last 24 hours: Temp:  [98.2 F (36.8 C)-100.5 F (38.1 C)] 98.9 F (37.2 C) (01/27 1229) Pulse Rate:  [76-92] 87 (01/27 1229) Resp:  [15-18] 18 (01/27 1229) BP: (96-113)/(61-71) 106/63 mmHg (01/27 1229) SpO2:  [99 %-100 %] 100 % (01/27 1229) Last BM Date: 03/13/14 (small with fleets)  Intake/Output from previous day: 01/26 0701 - 01/27 0700 In: -  Out: 20 [Drains:20] Intake/Output this shift:    PE: Gen:  Alert, NAD, pleasant Abd: Soft, mild tenderness, ND, +BS, no HSM, incisions intact staples now removed and steri-strips in place, drain now removed, dressing soiled by serosanguinous drainage, no wound dehiscence, mild erythema surrounding the lower part of the midline incision (pen marked around erythema/induration)   Lab Results:   Recent Labs  03/13/14 0525  WBC 9.3  HGB 8.8*  HCT 25.5*  PLT 437*   BMET  Recent Labs  03/13/14 0525  NA 137  K 2.8*  CL 102  CO2 29  GLUCOSE 87  BUN <5*  CREATININE 0.72  CALCIUM 7.5*   PT/INR No results for input(s): LABPROT, INR in the last 72 hours. CMP     Component Value Date/Time   NA 137 03/13/2014 0525   K 2.8* 03/13/2014 0525   CL 102 03/13/2014 0525   CO2 29 03/13/2014 0525   GLUCOSE 87 03/13/2014 0525   BUN <5* 03/13/2014 0525   CREATININE 0.72 03/13/2014 0525   CALCIUM 7.5* 03/13/2014 0525   PROT 5.4* 03/07/2014 0525   ALBUMIN 2.8* 03/07/2014 0525   AST 25 03/07/2014 0525   ALT 20 03/07/2014 0525   ALKPHOS 52 03/07/2014 0525   BILITOT 0.8 03/07/2014 0525   GFRNONAA >90 03/13/2014 0525   GFRAA >90  03/13/2014 0525   Lipase  No results found for: LIPASE     Studies/Results: No results found.  Anti-infectives: Anti-infectives    Start     Dose/Rate Route Frequency Ordered Stop   03/14/14 0900  doxycycline (VIBRA-TABS) tablet 100 mg     100 mg Oral Every 12 hours 03/14/14 0853     03/07/14 0200  cefoTEtan (CEFOTAN) 2 g in dextrose 5 % 50 mL IVPB     2 g100 mL/hr over 30 Minutes Intravenous Every 12 hours 03/06/14 2129 03/08/14 0223   03/06/14 0600  cefoTEtan (CEFOTAN) 2 g in dextrose 5 % 50 mL IVPB     2 g100 mL/hr over 30 Minutes Intravenous On call to O.R. 03/05/14 1331 03/06/14 1314       Assessment/Plan 1. Multiple uterine fibroids and menorrhagia--S/p HYSTERECTOMY ABDOMINAL, BILATERAL SALPINGECTOMY (Bilateral) 03/06/2014, Osborne Oman, MD 2. Sigmoid colon serosal tear, with Sigmoid colon resection, Dr. Randall Hiss Wilson,03/07/14. 3. Lovenox for DVT prophylaxis. 4. Hypokalemia 5.  Erythema/induration of the wound   Plan:  1.  Continue enemas, try smog, and laxatives, only had tiny BM yesterday 2.  Ambulate 10 laps a day at minimum, use IS 3.  SCD's and lovenox 4.  Will recheck bmet to make sure potassium isn't low, may need to d/c K+supplements 5.  On oral pain meds  only 6.  Agree with doxycycline for possible wound infection, likely needs for 7 days 7.  Staples were removed today and steri-strips replaced, drain discontinued but leaking fluid, continue dry dressings as needed. 8.  If wound erythema/induration improves with oral meds and she has a BM may be able to d/c home tomorrow.    LOS: 8 days    Coralie Keens 03/14/2014, 1:36 PM Pager: [phone]

## 2014-03-14 NOTE — Progress Notes (Signed)
I received a referral from pt's RN due to pt's length of stay.  Elizabeth Adkins expressed how much she just wants to feel better. She then asked if I could return at another time as she was tired.  We will attempt follow up later, but please also page as needs arise.  [employer] Pager, [phone] 1:27 PM    03/14/14 1300  Clinical Encounter Type  Visited With Patient  Visit Type Initial

## 2014-03-14 NOTE — Progress Notes (Signed)
Abdominal distention has decreased since SMOG was given, patient stated feels "much" better.

## 2014-03-14 NOTE — Progress Notes (Signed)
Patient only tolerated approximately 50cc of SMOG enema , refused rest of enema. Large amount soft dark stool expel.

## 2014-03-14 NOTE — Progress Notes (Signed)
8 Days Post-Op Procedure(s) (LRB): HYSTERECTOMY ABDOMINAL (N/A) BILATERAL SALPINGECTOMY (Bilateral) COLON RESECTION SIGMOID AND REANASTOMOSIS BY HAND  (N/A)  Subjective: Patient only had small BM in response to enema yesterday.  Tolerating regular diet, ambulating. Had temperature of 100.5 around 1700 yesterday.  Reports more incisional pain today.  Objective: I have reviewed patient's intake and output, medications and labs. Temp:  [98 F (36.7 C)-100.5 F (38.1 C)] 98.2 F (36.8 C) (01/27 0559) Pulse Rate:  [75-92] 76 (01/27 0559) Resp:  [15-18] 15 (01/27 0559) BP: (96-113)/(59-71) 96/61 mmHg (01/27 0559) SpO2:  [99 %-100 %] 100 % (01/27 0559)  JP output over past five days: 561, 520, 136, 120, 20  General: alert, cooperative and no distress Resp: clear to auscultation bilaterally GI: soft, non-tender; bowel sounds normal; no masses, no organomegaly, JP drain with serous drainage.  Incision: dry and intact with staples; 5 cm firm subcutaneous mass palpated under superior aspect of incision underneath the umbilicus.  Now has blanching erythema around the area, about 6 cm wide on either side of the incision, area marked.  Moderate tenderness to palpation.  Incisional staples removed, steristrips placed with benzoin. JP drain also removed, this incision also reapproximated with steristrips.  CBC Latest Ref Rng 03/13/2014 03/09/2014 03/08/2014  WBC 4.0 - 10.5 K/uL 9.3 13.1(H) 9.7  Hemoglobin 12.0 - 15.0 g/dL 8.8(L) 10.3(L) 9.9(L)  Hematocrit 36.0 - 46.0 % 25.5(L) 29.8(L) 29.4(L)  Platelets 150 - 400 K/uL 437(H) 268 243   BMP Latest Ref Rng 03/13/2014 03/08/2014 03/07/2014  Glucose 70 - 99 mg/dL 87 81 123(H)  BUN 6 - 23 mg/dL <5(L) <5(L) <5(L)  Creatinine 0.50 - 1.10 mg/dL 0.72 1.00 0.76  Sodium 135 - 145 mmol/L 137 139 137  Potassium 3.5 - 5.1 mmol/L 2.8(L) 3.5 4.1  Chloride 96 - 112 mmol/L 102 103 104  CO2 19 - 32 mmol/L 29 30 28   Calcium 8.4 - 10.5 mg/dL 7.5(L) 8.1(L) 8.1(L)    Assessment: s/p Procedure(s): HYSTERECTOMY ABDOMINAL (N/A) BILATERAL SALPINGECTOMY (Bilateral) COLON RESECTION SIGMOID AND REANASTOMOSIS BY HAND  (N/A):  Patient now has incisional infection  Plan: After consultation with Pharmacy and review of patient's allergies, will start Doxycycline 100 mg po bid today and monitor response.  If temperature >100.4 again, will draw blood cultures and check urine culture. Continue regular diet and GI medications as ordered Continue recommendations as per General Surgery, appreciate their input and expertise Routine postoperative care Disposition planning is pending bowel function and improvement with incisional infection    LOS: 8 days   Silverio Hagan A, MD 03/14/2014, 8:35 AM

## 2014-03-15 MED ORDER — SORBITOL 70 % SOLN
960.0000 mL | TOPICAL_OIL | Freq: Once | ORAL | Status: AC
  Administered 2014-03-15: 960 mL via RECTAL
  Filled 2014-03-15: qty 240

## 2014-03-15 MED ORDER — DOXYCYCLINE HYCLATE 100 MG PO TABS: 100.0000 mg | ORAL_TABLET | Freq: Two times a day (BID) | ORAL | Status: DC

## 2014-03-15 MED ORDER — MAGNESIUM CITRATE PO SOLN: 1.0000 | Freq: Every day | ORAL | Status: DC | PRN

## 2014-03-15 MED ORDER — IBUPROFEN 600 MG PO TABS: 600.0000 mg | ORAL_TABLET | Freq: Four times a day (QID) | ORAL | Status: AC | PRN

## 2014-03-15 MED ORDER — OXYCODONE-ACETAMINOPHEN 5-325 MG PO TABS: 1.0000 | ORAL_TABLET | Freq: Four times a day (QID) | ORAL | Status: AC | PRN

## 2014-03-15 MED ORDER — POLYETHYLENE GLYCOL 3350 17 G PO PACK: 17.0000 g | PACK | Freq: Every day | ORAL | Status: AC | PRN

## 2014-03-15 NOTE — Progress Notes (Signed)
Pt discharged to home with family. Condition stable.  Pt to car via wheelchair.  No equipment for home ordered at discharge.

## 2014-03-15 NOTE — Discharge Summary (Signed)
Gynecology Physician Discharge Summary  Patient ID: Elizabeth Adkins MRN: [id number] DOB/AGE: [birth year] 39 y.o.  Admit date: 03/06/2014 Discharge date: 03/15/2014  Preoperative Diagnoses: Symptomatic uterine fibroids and menorrhagia.  Procedure(s) (LRB): HYSTERECTOMY ABDOMINAL (N/A) BILATERAL SALPINGECTOMY (Bilateral) COLON RESECTION SIGMOID AND REANASTOMOSIS BY HAND  (N/A)   Consults: General Surgery  Brief Hospital Course:  Elizabeth Adkins is a 39 y.o. G0  admitted for scheduled surgery.  She underwent the procedures as mentioned above, her operation was complicated by serosal tear needing partial sigmoid colectomy and reanastomosis by General Surgery team. For further details about surgery, please refer to the operative reports. Patient had a prolonged postoperative course; she was also rounded on by General Surgery daily during her postoperative course, and their recommendations and expertise with her postoperative GI regimen were truly appreciated. During her surgery, an NGT was placed and this was discontinued on POD#1 once some bowel sounds were auscultated.  She was kept NPO initially and slowly advanced to clear liquids, full liquids, soft diet and regular diet over several days as her full bowel sounds returned and she passed flatus.  Patient had difficulty having bowel movements and she required multiple laxative agents including enemas to lead to bowel movements.  Postoperative course was further complicated by superficial incisional infection diagnosed on POD#7 and she was started on Doxycycline, which has appropriate MRSA coverage. There was no sign of worsening infection once antibiotics were initiated.  Intraperitoneal JP drain and incisional staples were removed on POD#8; steristrips placed over incision.  By time of discharge on POD#9, her pain was controlled on oral pain medications; she was ambulating, voiding without difficulty, tolerating regular diet, passing flatus and having  bowel movements with the assistance of laxatives and enemas. She was deemed stable for discharge to home with the plan to follow up in clinic in one week for incisional reevaluation.  Significant Lab Results: CBC Latest Ref Rng 03/13/2014 03/09/2014 03/08/2014  WBC 4.0 - 10.5 K/uL 9.3 13.1(H) 9.7  Hemoglobin 12.0 - 15.0 g/dL 8.8(L) 10.3(L) 9.9(L)  Hematocrit 36.0 - 46.0 % 25.5(L) 29.8(L) 29.4(L)  Platelets 150 - 400 K/uL 437(H) 268 243   BMP Latest Ref Rng 03/14/2014 03/13/2014 03/08/2014  Glucose 70 - 99 mg/dL 92 87 81  BUN 6 - 23 mg/dL <5(L) <5(L) <5(L)  Creatinine 0.50 - 1.10 mg/dL 0.70 0.72 1.00  Sodium 135 - 145 mmol/L 136 137 139  Potassium 3.5 - 5.1 mmol/L 3.6 2.8(L) 3.5  Chloride 96 - 112 mmol/L 104 102 103  CO2 19 - 32 mmol/L 27 29 30   Calcium 8.4 - 10.5 mg/dL 7.6(L) 7.5(L) 8.1(L)   Surgical Pathology 1. Uterus and cervix, with bilateral fallopian tubes - SECRETORY PHASE ENDOMETRIUM WITH UNDERLYING MYOMETRIUM DEMONSTRATING LEIOMYOMA FORMATION. - SEROSAL ADHESIONS PRESENT. - BENIGN CERVIX. - BENIGN BILATERAL FALLOPIAN TUBES. - NO ATYPIA, HYPERPLASIA, DYSPLASIA, OR MALIGNANCY IDENTIFIED. 2. Colon, segmental resection, sigmoid - BENIGN COLORECTAL MUCOSA WITH PARTIALLY DISRUPTED SEROSA WITH ASSOCIATED HEMORRHAGE. - NO DYSPLASIA, MALIGNANCY OR TUMOR SEEN.   Discharged Condition: Stable  Disposition: 01-Home or Self Care    Medication List    TAKE these medications        albuterol 108 (90 BASE) MCG/ACT inhaler  Commonly known as:  PROVENTIL HFA;VENTOLIN HFA  Inhale 2 puffs into the lungs every 6 (six) hours as needed for wheezing or shortness of breath.     doxycycline 100 MG tablet  Commonly known as:  VIBRA-TABS  Take 1 tablet (100 mg total) by mouth every 12 (twelve)  hours.     ibuprofen 600 MG tablet  Commonly known as:  ADVIL,MOTRIN  Take 1 tablet (600 mg total) by mouth every 6 (six) hours as needed for headache, mild pain or moderate pain.     magnesium  citrate Soln  Take 296 mLs (1 Bottle total) by mouth daily as needed for severe constipation.     oxyCODONE-acetaminophen 5-325 MG per tablet  Commonly known as:  PERCOCET/ROXICET  Take 1-2 tablets by mouth every 6 (six) hours as needed for severe pain.     polyethylene glycol packet  Commonly known as:  MIRALAX / GLYCOLAX  Take 17 g by mouth daily as needed for moderate constipation or severe constipation.       Follow-up Information    Follow up with Osborne Oman, MD On 03/23/2014.   Specialty:  Obstetrics and Gynecology   Why:  10 am for wound re-check. Call clinic/come to [facility] for any concerning issues.   Contact information:   [place] [address] [phone]       Signed: Verita Schneiders, MD, [place] Attending [place], [facility]

## 2014-03-15 NOTE — Progress Notes (Signed)
9 Days Post-Op Procedure(s) (LRB): HYSTERECTOMY ABDOMINAL (N/A) BILATERAL SALPINGECTOMY (Bilateral) COLON RESECTION SIGMOID AND REANASTOMOSIS BY HAND  (N/A)  Subjective: Patient had large BM in response to enema yesterday.  Tolerating regular diet, ambulating. No further temperatures above >100.4 since initiating Doxycycline for incisional infection yesterday.  Improved pain around incision.  Objective: I have reviewed patient's intake and output, medications and labs. Temp:  [98 F (36.7 C)-100.2 F (37.9 C)] 98 F (36.7 C) (01/28 0537) Pulse Rate:  [57-87] 57 (01/28 0537) Resp:  [18] 18 (01/28 0537) BP: (90-112)/(52-88) 90/52 mmHg (01/28 0537) SpO2:  [99 %-100 %] 100 % (01/28 0537)  General: alert, cooperative and no distress Resp: clear to auscultation bilaterally GI: soft, non-tender; bowel sounds normal; no masses, no organomegaly. Incision: dry and intact with steristrips.  Unchanged area of blanching erythema around the area, about 6 cm wide on either side of the incision, area marked.  Mild tenderness to palpation.  CBC Latest Ref Rng 03/13/2014 03/09/2014 03/08/2014  WBC 4.0 - 10.5 K/uL 9.3 13.1(H) 9.7  Hemoglobin 12.0 - 15.0 g/dL 8.8(L) 10.3(L) 9.9(L)  Hematocrit 36.0 - 46.0 % 25.5(L) 29.8(L) 29.4(L)  Platelets 150 - 400 K/uL 437(H) 268 243   BMP Latest Ref Rng 03/14/2014 03/13/2014 03/08/2014  Glucose 70 - 99 mg/dL 92 87 81  BUN 6 - 23 mg/dL <5(L) <5(L) <5(L)  Creatinine 0.50 - 1.10 mg/dL 0.70 0.72 1.00  Sodium 135 - 145 mmol/L 136 137 139  Potassium 3.5 - 5.1 mmol/L 3.6 2.8(L) 3.5  Chloride 96 - 112 mmol/L 104 102 103  CO2 19 - 32 mmol/L 27 29 30   Calcium 8.4 - 10.5 mg/dL 7.6(L) 7.5(L) 8.1(L)   Assessment: s/p Procedure(s): HYSTERECTOMY ABDOMINAL (N/A) BILATERAL SALPINGECTOMY (Bilateral) COLON RESECTION SIGMOID AND REANASTOMOSIS BY HAND  (N/A):  Superfificial incisional infection  Plan: Continue Doxycycline 100 mg po bid today  Continue regular diet and GI  medications as ordered Continue recommendations as per General Surgery, appreciate their input and expertise Will discharge to home today with plan to follow up in clinic next week    LOS: 9 days   ANYANWU,UGONNA A, MD 03/15/2014, 9:08 AM

## 2014-03-15 NOTE — Discharge Instructions (Signed)
Abdominal Hysterectomy, Care After Refer to this sheet in the next few weeks. These instructions provide you with information on caring for yourself after your procedure. Your health care provider may also give you more specific instructions. Your treatment has been planned according to current medical practices, but problems sometimes occur. Call your health care provider if you have any problems or questions after your procedure.  WHAT TO EXPECT AFTER THE PROCEDURE After your procedure, it is typical to have the following:  Pain.  Feeling tired.  Poor appetite.  Less interest in sex. HOME CARE INSTRUCTIONS  It takes 4-6 weeks to recover from this surgery. Make sure you follow all your health care provider's instructions. Home care instructions may include:  Take pain medicines only as directed by your health care provider. Do not take over-the-counter pain medicines without checking with your health care provider first.  Change your bandage as directed by your health care provider.  Return to your health care provider to have your sutures taken out.  Take showers instead of baths for 2-3 weeks. Ask your health care provider when it is safe to start showering.  Do not douche, use tampons, or have sexual intercourse for at least 6 weeks or until your health care provider says you can.   Follow your health care provider's advice about exercise, lifting, driving, and general activities.  Get plenty of rest and sleep.   Do not lift anything heavier than a gallon of milk (about 10 lb [4.5 kg]) for the first month after surgery.  You can resume your normal diet if your health care provider says it is okay.   Do not drink alcohol until your health care provider says you can.   If you are constipated, ask your health care provider if you can take a mild laxative.  Eating foods high in fiber may also help with constipation. Eat plenty of raw fruits and vegetables, whole grains, and  beans.  Drink enough fluids to keep your urine clear or pale yellow.   Try to have someone at home with you for the first 1-2 weeks to help around the house.  Keep all follow-up appointments. SEEK MEDICAL CARE IF:   You have chills or fever.  You have swelling, redness, or pain in the area of your incision that is getting worse.   You have pus coming from the incision.   You notice a bad smell coming from the incision or bandage.   Your incision breaks open.   You feel dizzy or light-headed.   You have pain or bleeding when you urinate.   You have persistent diarrhea.   You have persistent nausea and vomiting.   You have abnormal vaginal discharge.   You have a rash.   You have any type of abnormal reaction or develop an allergy to your medicine.   Your pain medicine is not helping.  SEEK IMMEDIATE MEDICAL CARE IF:   You have a fever and your symptoms suddenly get worse.  You have severe abdominal pain.  You have chest pain.  You have shortness of breath.  You faint.  You have pain, swelling, or redness of your leg.  You have heavy vaginal bleeding with blood clots. MAKE SURE YOU:  Understand these instructions.  Will watch your condition.  Will get help right away if you are not doing well or get worse. Document Released: 08/22/2004 Document Revised: 02/07/2013 Document Reviewed: 11/25/2012 [facility] Patient Information 2015 [place], [place]. This information is not intended  to replace advice given to you by your health care provider. Make sure you discuss any questions you have with your health care provider.   Open Colectomy, Care After Refer to this sheet in the next few weeks. These instructions provide you with information on caring for yourself after your procedure. Your health care provider may also give you more specific instructions. Your treatment has been planned according to current medical practices, but problems sometimes occur.  Call your health care provider if you have any problems or questions after your procedure. WHAT TO EXPECT AFTER THE PROCEDURE After your procedure, it is typical to have the following:  Pain in your abdomen, especially along your incision. You will be given medicines to control the pain.  Tiredness. This is a normal part of the recovery process. Your energy level will return to normal over the next several weeks.  Constipation. You may be given a stool softener to prevent this. HOME CARE INSTRUCTIONS  Only take over-the-counter or prescription medicines as directed by your health care provider.  Ask your health care provider whether you may take a shower when you go home.  You may resume a normal diet and activities as directed. Eat plenty of fruits and vegetables to help prevent constipation.  Drink enough fluids to keep your urine clear or pale yellow. This also helps prevent constipation.  Take rest breaks during the day as needed.  Avoid lifting anything heavier than 25 pounds (11.3 kg) or driving for 4 weeks or until your health care provider says it is okay. Follow up with your health care provider as directed.   SEEK MEDICAL CARE IF:  You have redness, swelling, or increasing pain in the incision area.  You see pus coming from the incision area.  You have a fever. SEEK IMMEDIATE MEDICAL CARE IF:   You have chest pain or shortness of breath.  You have pain or swelling in your legs.  You have persistent nausea and vomiting.  Your wound breaks open after stitches or staples have been removed.  You have increasing abdominal pain that is not relieved with medicine. Document Released: 08/26/2010 Document Revised: 11/23/2012 Document Reviewed: 09/14/2012 [facility] Patient Information 2015 [place], [place]. This information is not intended to replace advice given to you by your health care provider. Make sure you discuss any questions you have with your health care  provider.

## 2014-03-23 ENCOUNTER — Encounter: Payer: Self-pay | Admitting: Obstetrics & Gynecology

## 2014-03-23 ENCOUNTER — Ambulatory Visit (INDEPENDENT_AMBULATORY_CARE_PROVIDER_SITE_OTHER): Payer: Self-pay | Admitting: Obstetrics & Gynecology

## 2014-03-23 VITALS — BP 125/87 | HR 84 | Temp 98.2°F | Ht 61.0 in | Wt 162.9 lb

## 2014-03-23 DIAGNOSIS — Z9049 Acquired absence of other specified parts of digestive tract: Secondary | ICD-10-CM

## 2014-03-23 DIAGNOSIS — T814XXD Infection following a procedure, subsequent encounter: Secondary | ICD-10-CM

## 2014-03-23 DIAGNOSIS — IMO0001 Reserved for inherently not codable concepts without codable children: Secondary | ICD-10-CM

## 2014-03-23 DIAGNOSIS — Z9889 Other specified postprocedural states: Secondary | ICD-10-CM

## 2014-03-23 DIAGNOSIS — Z4889 Encounter for other specified surgical aftercare: Secondary | ICD-10-CM

## 2014-03-23 DIAGNOSIS — Z90710 Acquired absence of both cervix and uterus: Secondary | ICD-10-CM

## 2014-03-23 NOTE — Progress Notes (Signed)
   CLINIC ENCOUNTER NOTE  History:  39 y.o. G0P0000 here today for incisional check s/p TAH, BS complicated by serosal tear needing partial sigmoid colectomy and reanastomosis; also superficial incisional infection.  Discharged to home on POD#10; Doxycycline prescribed for infection.  Patient reports that she did not get antibiotics as they were too expensive and reports some pus coming from a section of the wound. Overall, feels better.  Minimal pain. No problems with eating, drinking, bladder or bowel habits.   The following portions of the patient's history were reviewed and updated as appropriate: allergies, current medications, past family history, past medical history, past social history, past surgical history and problem list.    Review of Systems:  Pertinent items are noted in HPI.  Objective:  Physical Exam BP 125/87 mmHg  Pulse 84  Temp(Src) 98.2 F (36.8 C) (Oral)  Ht 5\' 1"  (1.549 m)  Wt 162 lb 14.4 oz (73.891 kg)  BMI 30.80 kg/m2  LMP 11/04/2013 Gen: NAD Abd: Soft, nontender and nondistended.  Steristrips removed from vertical incision, resolved erythema around area of concern. Superficial wound separation noted in two areas in the inferior part of the incision, area was cleaned, treated with silver nitrate and reapproximated with steristrips. JP incision site also treated in similar fashion. No other concerns. Pelvic: Deferred  Assessment & Plan:  Resolving superficial infection; no need for antibiotics for now Will reevaluate in one week.   Verita Schneiders, MD, [place] Attending [place] for [employer], [place]

## 2014-03-23 NOTE — Patient Instructions (Signed)
Return to clinic for any scheduled appointments or for any gynecologic concerns as needed.   

## 2014-03-29 ENCOUNTER — Ambulatory Visit: Payer: Self-pay

## 2014-04-02 ENCOUNTER — Ambulatory Visit: Payer: Self-pay

## 2014-04-04 ENCOUNTER — Ambulatory Visit (INDEPENDENT_AMBULATORY_CARE_PROVIDER_SITE_OTHER): Payer: Self-pay | Admitting: Obstetrics & Gynecology

## 2014-04-04 DIAGNOSIS — N898 Other specified noninflammatory disorders of vagina: Secondary | ICD-10-CM

## 2014-04-04 DIAGNOSIS — Z9889 Other specified postprocedural states: Secondary | ICD-10-CM

## 2014-04-04 DIAGNOSIS — Z4889 Encounter for other specified surgical aftercare: Secondary | ICD-10-CM

## 2014-04-04 DIAGNOSIS — Z9049 Acquired absence of other specified parts of digestive tract: Secondary | ICD-10-CM

## 2014-04-04 DIAGNOSIS — Z90710 Acquired absence of both cervix and uterus: Secondary | ICD-10-CM

## 2014-04-04 MED ORDER — METRONIDAZOLE 500 MG PO TABS: 500.0000 mg | ORAL_TABLET | Freq: Two times a day (BID) | ORAL | Status: AC

## 2014-04-04 NOTE — Patient Instructions (Signed)
Return to clinic for any scheduled appointments or for any gynecologic concerns as needed.   

## 2014-04-04 NOTE — Progress Notes (Signed)
   CLINIC ENCOUNTER NOTE  History:  39 y.o. G0 here today for reevaluation of incisional check s/p TAH, BS complicated by serosal tear needing partial sigmoid colectomy and reanastomosis; also superficial incisional infection.  Was last seen on 03/23/14 and infection was noted to be resolving.  Today, she reports feeling significantly better, no drainage.    Patient also reports malodorous vaginal discharge, wants this to be evaluated.  The following portions of the patient's history were reviewed and updated as appropriate: allergies, current medications, past family history, past medical history, past social history, past surgical history and problem list.   Review of Systems:  Pertinent items are noted in HPI.  Objective:  Physical Exam AFVSS LMP 11/04/2013 Gen: NAD Abd: Soft, nontender and nondistended. Incision is well-healed. No erythema, drainage, induration. Well-healing JP site. Pelvic: Normal appearing external genitalia; normal appearing vaginal mucosa and healing vaginal cuff.  Yellow, malodorous discharge noted,  sample obtained for wet prep.   Assessment & Plan:  Incision is healing well, no further intervention needed. Metronidazole presumptively prescribed; will still follow up wet prep results and manage accordingly. Routine preventative health maintenance measures emphasized.   Verita Schneiders, MD, [place] Attending [place] for [employer], [place]

## 2014-04-05 LAB — WET PREP, GENITAL
Trich, Wet Prep: NONE SEEN
Yeast Wet Prep HPF POC: NONE SEEN

## 2015-05-30 IMAGING — CT CT ABD-PELV W/ CM
2 of 4 series · 17 of 46 positions shown, 19 images · IV contrast (APPLIED)
Comparison: None.

CLINICAL DATA: Diffuse abdominal pain, nausea

EXAM:
CT ABDOMEN AND PELVIS WITH CONTRAST
TECHNIQUE: Multidetector CT imaging of the abdomen and pelvis was performed
using the standard protocol following bolus administration of
intravenous contrast.
CONTRAST:  50mL OMNIPAQUE IOHEXOL 300 MG/ML SOLN, 100mL OMNIPAQUE
IOHEXOL 300 MG/ML SOLN

[Series 2: abd/pelvis 5.0 b31f · axial · 0.77mm/px · z∈[-475,-80]mm · 14 of 87 slices shown, 16 images]
[im 4/87  soft-tissue]
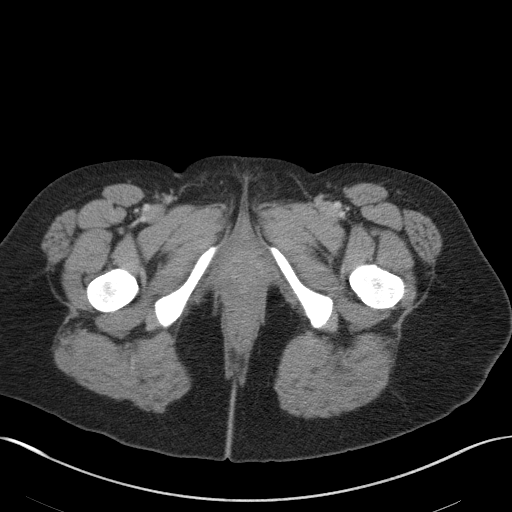
[im 4/87  bone]
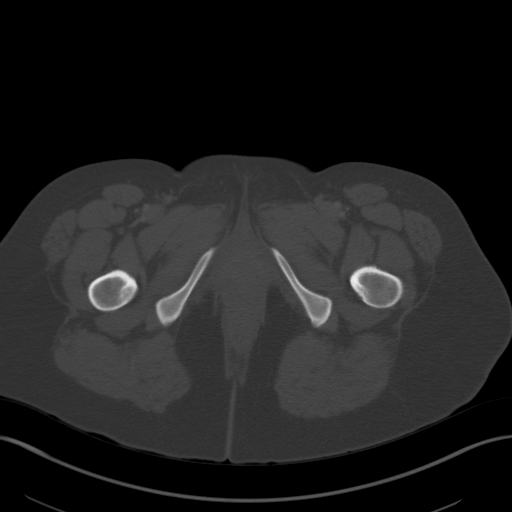
[im 11/87  soft-tissue]
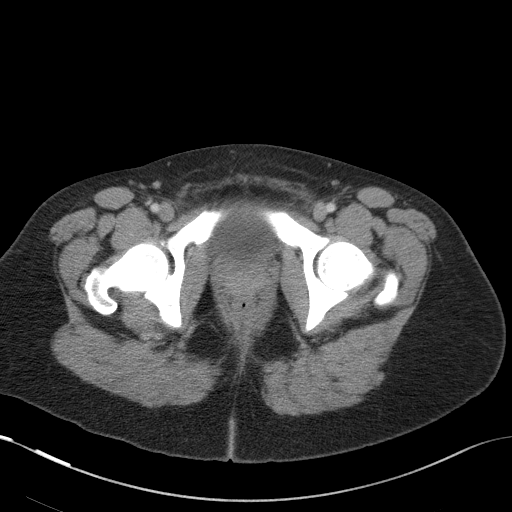
[im 18/87  soft-tissue]
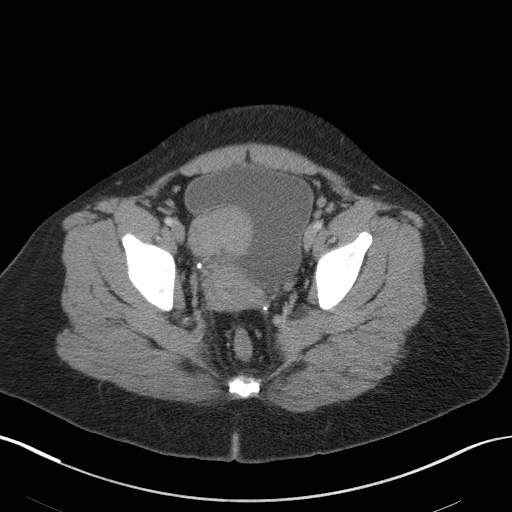
[im 25/87  soft-tissue]
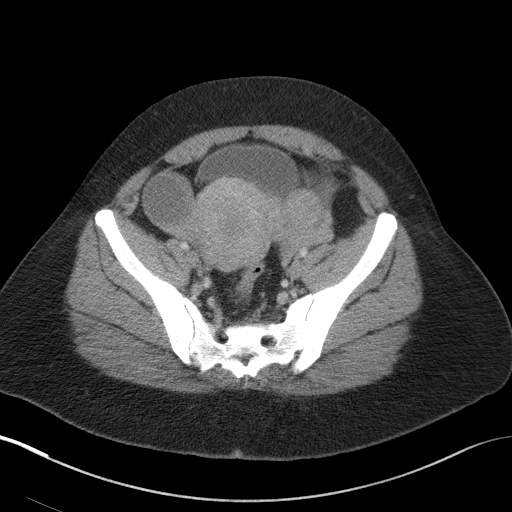
[im 28/87  soft-tissue]
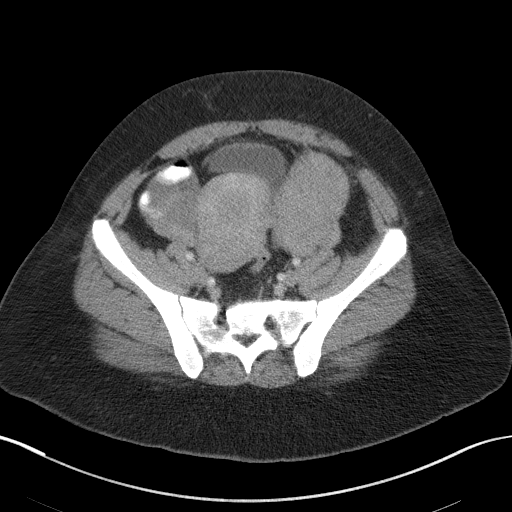
[im 35/87  soft-tissue]
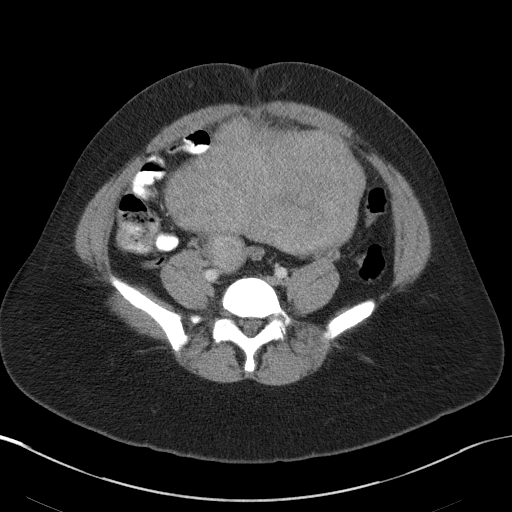
[im 42/87  soft-tissue]
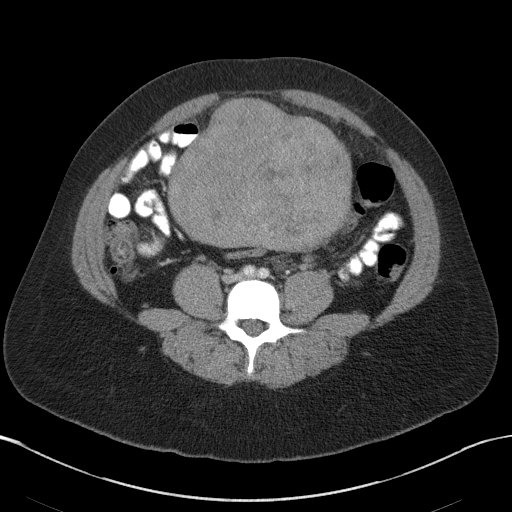
[im 45/87  soft-tissue]
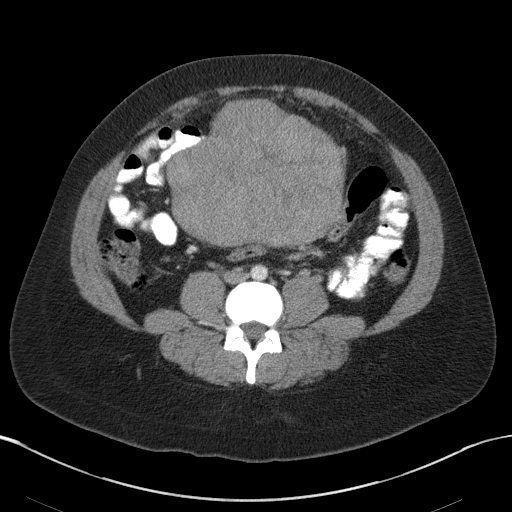
[im 52/87  soft-tissue]
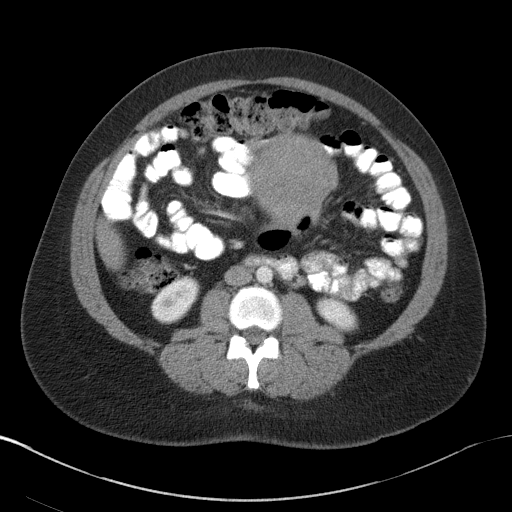
[im 52/87  bone]
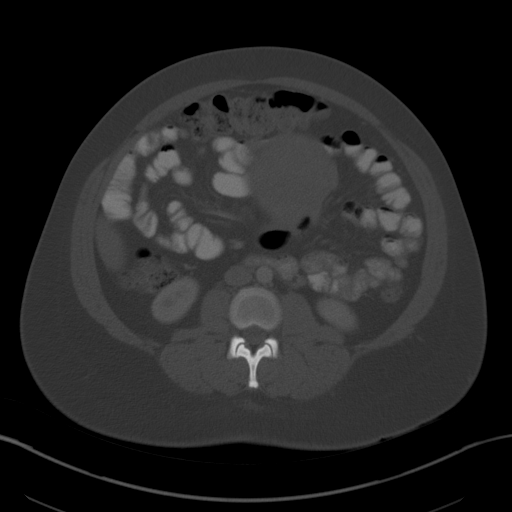
[im 59/87  soft-tissue]
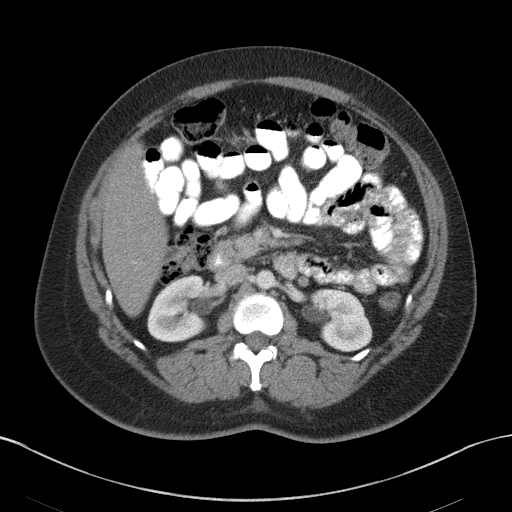
[im 66/87  soft-tissue]
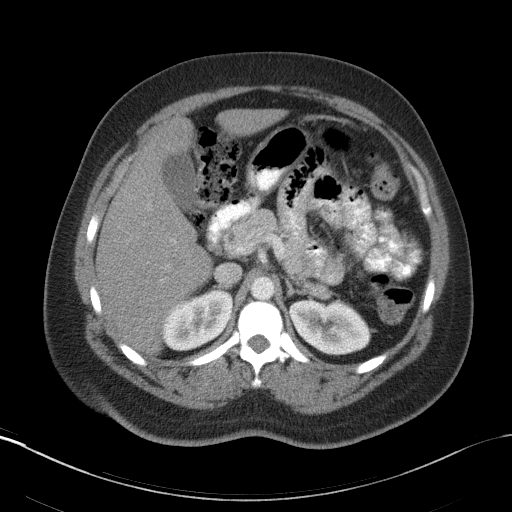
[im 69/87  soft-tissue]
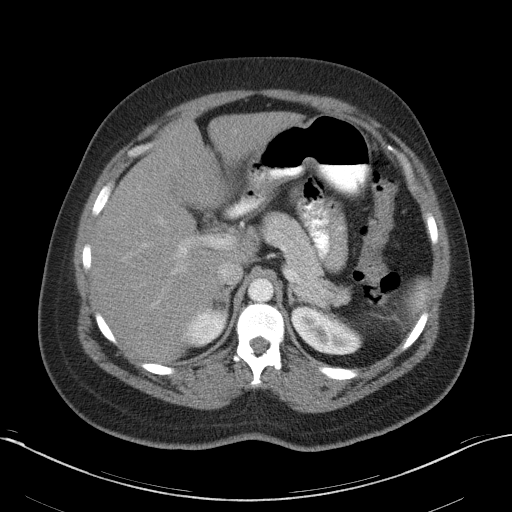
[im 76/87  soft-tissue]
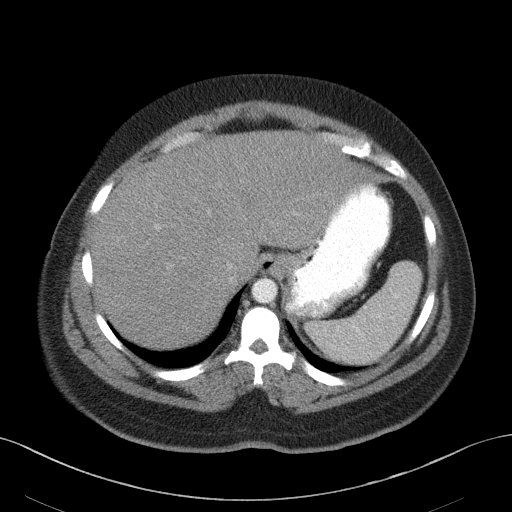
[im 83/87  soft-tissue]
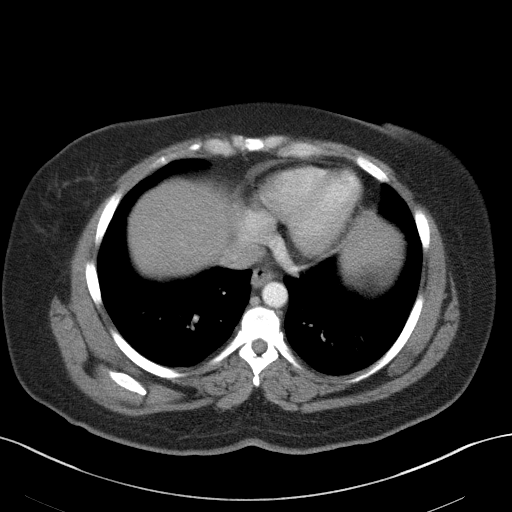

[Series 5: abd/pelvis 3.0 coronal · coronal · 0.83mm/px · 3 of 107 slices shown]
[im 36/107  soft-tissue]
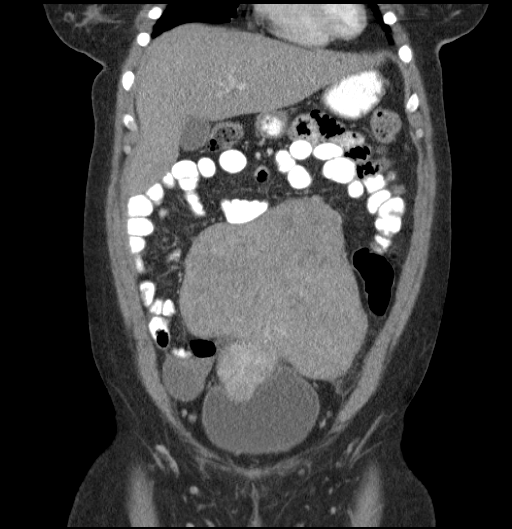
[im 48/107  soft-tissue]
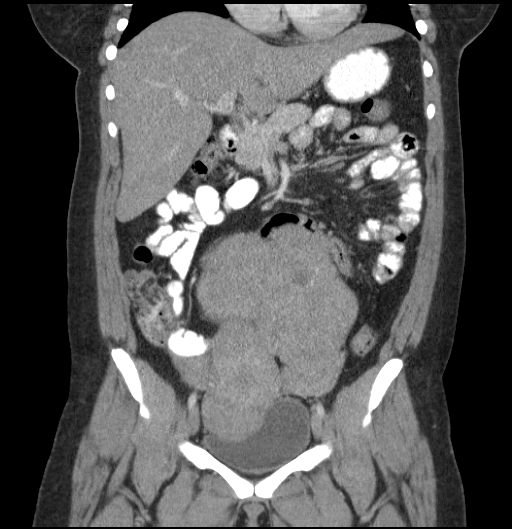
[im 59/107  soft-tissue]
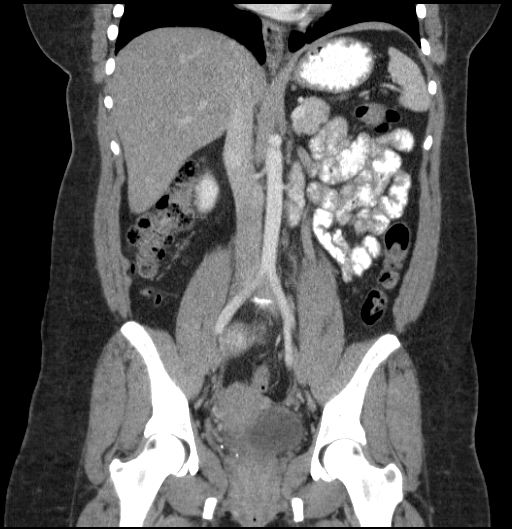

[17 of 46 positions shown; findings below may reference images not displayed]

FINDINGS: Lung bases are clear.

Liver, spleen, pancreas, and adrenal glands are within normal
limits.

Gallbladder is unremarkable. No intrahepatic or extrahepatic ductal
dilatation.

Kidneys are within normal limits.  No hydronephrosis.

No evidence of bowel obstruction.  Normal appendix.

No evidence of abdominal aortic aneurysm. Retroaortic left renal
vein.

No suspicious abdominopelvic lymphadenopathy.

Uterus is notable for multiple uterine fibroids, including a
dominant 11.0 x 15.0 x 11.8 cm pedunculated fundal fibroid (series
2/ image 49).

Left ovary is within normal limits. Right ovary is notable for a
dominant 3.9 cm cyst/follicle, likely physiologic.

Trace left pelvic fluid.

Bladder is within normal limits.

Visualized osseous structures are within normal limits.
IMPRESSION: No evidence of bowel obstruction.  Normal appendix.

Multiple uterine fibroids, including a dominant 15 cm pedunculated
fundal fibroid.

Otherwise, no CT findings to account for the patient's abdominal
pain.

## 2016-01-15 IMAGING — CR DG ABD PORTABLE 1V
1 series · 1 of 1 positions shown · non-contrast
Comparison: None.

CLINICAL DATA: Status post hysterectomy, check nasogastric catheter
placement

EXAM:
PORTABLE ABDOMEN - 1 VIEW

[view not recorded]
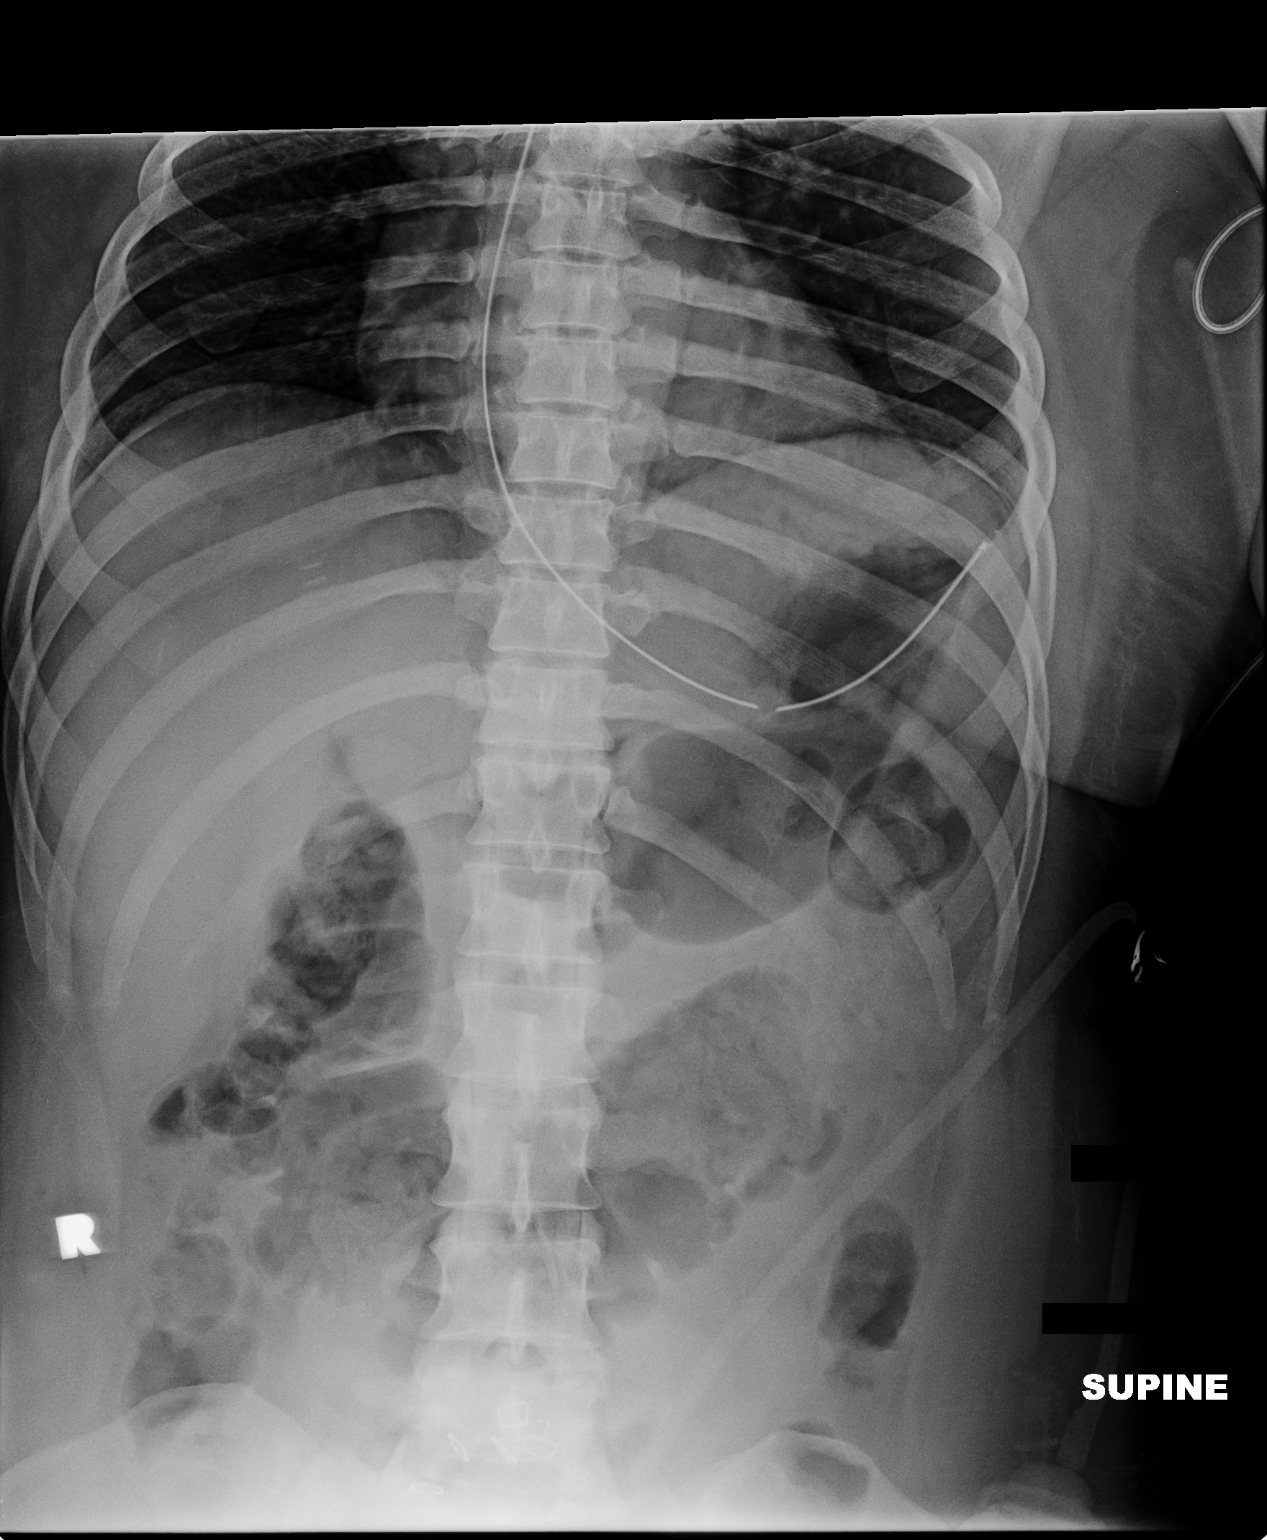

[1 of 1 positions shown; findings below may reference images not displayed]

FINDINGS: A nasogastric catheter is noted within the stomach. Scattered large
and small bowel gas is seen. No free air is noted. No acute
abnormality is seen.
IMPRESSION: Nasogastric catheter within the stomach.
